# Patient Record
Sex: Female | Born: 1950 | Race: White | Hispanic: No | Smoking: Never smoker
Health system: Southern US, Community
[De-identification: ages and names within clinical notes are randomized; demographics above are authoritative.]

## PROBLEM LIST (undated history)

## (undated) DIAGNOSIS — E785 Hyperlipidemia, unspecified: Secondary | ICD-10-CM

## (undated) DIAGNOSIS — J45909 Unspecified asthma, uncomplicated: Secondary | ICD-10-CM

## (undated) DIAGNOSIS — W19XXXA Unspecified fall, initial encounter: Secondary | ICD-10-CM

## (undated) DIAGNOSIS — I10 Essential (primary) hypertension: Secondary | ICD-10-CM

## (undated) DIAGNOSIS — M6281 Muscle weakness (generalized): Secondary | ICD-10-CM

## (undated) DIAGNOSIS — J449 Chronic obstructive pulmonary disease, unspecified: Secondary | ICD-10-CM

## (undated) DIAGNOSIS — E119 Type 2 diabetes mellitus without complications: Secondary | ICD-10-CM

## (undated) DIAGNOSIS — S72009A Fracture of unspecified part of neck of unspecified femur, initial encounter for closed fracture: Secondary | ICD-10-CM

## (undated) DIAGNOSIS — S62109A Fracture of unspecified carpal bone, unspecified wrist, initial encounter for closed fracture: Secondary | ICD-10-CM

## (undated) DIAGNOSIS — K635 Polyp of colon: Secondary | ICD-10-CM

## (undated) HISTORY — PX: CHOLECYSTECTOMY: SHX55

## (undated) HISTORY — DX: Muscle weakness (generalized): M62.81

## (undated) HISTORY — DX: Hyperlipidemia, unspecified: E78.5

## (undated) HISTORY — DX: Polyp of colon: K63.5

## (undated) HISTORY — DX: Type 2 diabetes mellitus without complications: E11.9

## (undated) HISTORY — DX: Fracture of unspecified carpal bone, unspecified wrist, initial encounter for closed fracture: S62.109A

## (undated) HISTORY — PX: TOTAL HIP ARTHROPLASTY: SHX124

## (undated) HISTORY — DX: Unspecified fall, initial encounter: W19.XXXA

## (undated) HISTORY — DX: Fracture of unspecified part of neck of unspecified femur, initial encounter for closed fracture: S72.009A

## (undated) HISTORY — PX: ICD IMPLANT: EP1208

## (undated) HISTORY — DX: Unspecified asthma, uncomplicated: J45.909

## (undated) HISTORY — DX: Essential (primary) hypertension: I10

## (undated) HISTORY — PX: DILATION AND CURETTAGE OF UTERUS: SHX78

## (undated) HISTORY — PX: JOINT REPLACEMENT: SHX530

## (undated) HISTORY — DX: Chronic obstructive pulmonary disease, unspecified: J44.9

---

## 1978-11-29 HISTORY — PX: TOTAL ABDOMINAL HYSTERECTOMY: SHX209

## 1999-02-23 ENCOUNTER — Emergency Department (HOSPITAL_COMMUNITY): Admission: EM | Admit: 1999-02-23 | Discharge: 1999-02-23 | Payer: Self-pay | Admitting: Emergency Medicine

## 2018-02-22 ENCOUNTER — Encounter: Payer: Self-pay | Admitting: Internal Medicine

## 2018-02-22 ENCOUNTER — Non-Acute Institutional Stay (SKILLED_NURSING_FACILITY): Payer: Medicare Other | Admitting: Internal Medicine

## 2018-02-22 DIAGNOSIS — E785 Hyperlipidemia, unspecified: Secondary | ICD-10-CM | POA: Diagnosis not present

## 2018-02-22 DIAGNOSIS — F325 Major depressive disorder, single episode, in full remission: Secondary | ICD-10-CM

## 2018-02-22 DIAGNOSIS — E119 Type 2 diabetes mellitus without complications: Secondary | ICD-10-CM | POA: Diagnosis not present

## 2018-02-22 DIAGNOSIS — S32402D Unspecified fracture of left acetabulum, subsequent encounter for fracture with routine healing: Secondary | ICD-10-CM | POA: Diagnosis not present

## 2018-02-22 DIAGNOSIS — F419 Anxiety disorder, unspecified: Secondary | ICD-10-CM | POA: Diagnosis not present

## 2018-02-22 DIAGNOSIS — N3090 Cystitis, unspecified without hematuria: Secondary | ICD-10-CM | POA: Diagnosis not present

## 2018-02-22 DIAGNOSIS — I5043 Acute on chronic combined systolic (congestive) and diastolic (congestive) heart failure: Secondary | ICD-10-CM

## 2018-02-22 DIAGNOSIS — B9689 Other specified bacterial agents as the cause of diseases classified elsewhere: Secondary | ICD-10-CM

## 2018-02-22 DIAGNOSIS — J449 Chronic obstructive pulmonary disease, unspecified: Secondary | ICD-10-CM

## 2018-02-22 DIAGNOSIS — S62102D Fracture of unspecified carpal bone, left wrist, subsequent encounter for fracture with routine healing: Secondary | ICD-10-CM

## 2018-02-22 DIAGNOSIS — Z794 Long term (current) use of insulin: Secondary | ICD-10-CM | POA: Diagnosis not present

## 2018-02-22 DIAGNOSIS — B961 Klebsiella pneumoniae [K. pneumoniae] as the cause of diseases classified elsewhere: Secondary | ICD-10-CM

## 2018-02-22 NOTE — Progress Notes (Signed)
: Provider:  Noah Delaine. Sheppard Coil, MD Location:  [facility] Room Number: 130 Place of Service:  SNF ([phone])  PCP: Hennie Duos, MD Patient Care Team: Hennie Duos, MD as PCP - General (Internal Medicine)  Extended Emergency Contact Information Primary Emergency Contact: [place] Phone: [phone] Relation: Daughter Secondary Emergency Contact: Anne Hahn Home Phone: [phone] Relation: Son     Allergies: Azithromycin; Codeine; Demerol [meperidine hcl]; Erythromycin; Penicillins; and Sulfamethizole  Chief Complaint  Patient presents with  . New Admit To SNF    HPI: Patient is 67 y.o. female with history of diabetes type 2 COPD and hypertension who presented to [facility] ED after a fall.  She lost her balance at home and fell after which she complained of left hip and left wrist pain.  She did bump her head but no loss of consciousness or neck pain.  She has a history of hip fracture several years ago and underwent surgery in [place].  Patient was admitted to [facility] from 3/20-26 where she was diagnosed and treated for a left wrist fracture and a left acetabular fracture.  Orthopedics did not recommend inpatient surgery for either fracture, the left wrist was splinted in the emergency department.  Acetabular fracture was nondisplaced, conservative care and family wishes patient to follow-up with her own orthopedic surgeon Dr. Lyla Glassing ingrained spur after discharge.  Hospital course was complicated by acute on chronic congestive heart failure treated with IV Lasix with resolution and with a UTI that grew out Klebsiella and was treated with Levaquin.  Patient had significant anxiety and was seen by psychiatry who started her on Xanax.  Patient is admitted to skilled nursing facility for OT/PT.  While at skilled nursing facility patient will be followed for hypertension treated with Coreg Lasix and  Cozaar, diabetes mellitus type 2 treated with insulin and Glucophage and hyperlipidemia treated with Lipitor.  Past Medical History:  Diagnosis Date  . Asthma   . COPD (chronic obstructive pulmonary disease) ([place])   . Diabetes ([place])   . Fall   . Hip fracture ([place])   . HLD (hyperlipidemia)   . HTN (hypertension)   . Muscle weakness   . Wrist fracture     Past Surgical History:  Procedure Laterality Date  . CHOLECYSTECTOMY    . DILATION AND CURETTAGE OF UTERUS    . TOTAL ABDOMINAL HYSTERECTOMY    . TOTAL HIP ARTHROPLASTY Left     Allergies as of 02/22/2018      Reactions   Azithromycin    Codeine    Demerol [meperidine Hcl]    Erythromycin    Penicillins    Sulfamethizole       Medication List        Accurate as of 02/22/18 11:29 AM. Always use your most recent med list.          albuterol 108 (90 Base) MCG/ACT inhaler Commonly known as:  PROVENTIL HFA;VENTOLIN HFA Inhale 2 puffs into the lungs every 6 (six) hours as needed for wheezing or shortness of breath.   ALPRAZolam 0.25 MG tablet Commonly known as:  XANAX Take 0.25 mg by mouth 2 (two) times daily.   aspirin EC 81 MG tablet Take 81 mg by mouth daily.   atorvastatin 20 MG tablet Commonly known as:  LIPITOR Take 20 mg by mouth daily.   BREO ELLIPTA 200-25 MCG/INH Aepb Generic drug:  fluticasone furoate-vilanterol Inhale 1 puff into the  lungs daily.   calcium citrate-vitamin D 315-200 MG-UNIT tablet Commonly known as:  CITRACAL+D Take 1 tablet by mouth daily.   carvedilol 6.25 MG tablet Commonly known as:  COREG Take 6.25 mg by mouth 2 (two) times daily with a meal.   furosemide 40 MG tablet Commonly known as:  LASIX Take 40 mg by mouth daily.   LANTUS SOLOSTAR [place] Inject 20 Units into the skin daily with breakfast.   levofloxacin 500 MG tablet Commonly known as:  LEVAQUIN Take 500 mg by mouth daily.   losartan 50 MG tablet Commonly known as:  COZAAR Take 50 mg by mouth daily.     metFORMIN 500 MG tablet Commonly known as:  GLUCOPHAGE Take 500 mg by mouth 2 (two) times daily with a meal.   pantoprazole 40 MG tablet Commonly known as:  PROTONIX Take 40 mg by mouth daily.   sertraline 100 MG tablet Commonly known as:  ZOLOFT Take 200 mg by mouth daily.       No orders of the defined types were placed in this encounter.    There is no immunization history on file for this patient.  Social History   Tobacco Use  . Smoking status: Never Smoker  . Smokeless tobacco: Never Used  Substance Use Topics  . Alcohol use: Not on file    Family history is   Family History  Problem Relation Age of Onset  . AAA (abdominal aortic aneurysm) Other       Review of Systems  DATA OBTAINED: from patient, nurse GENERAL:  no fevers, fatigue, appetite changes SKIN: No itching, or rash EYES: No eye pain, redness, discharge EARS: No earache, tinnitus, change in hearing NOSE: No congestion, drainage or bleeding  MOUTH/THROAT: No mouth or tooth pain, No sore throat RESPIRATORY: No cough, wheezing, SOB CARDIAC: No chest pain, palpitations, lower extremity edema  GI: No abdominal pain, No N/V/D or constipation, No heartburn or reflux  GU: No dysuria, frequency or urgency, or incontinence  MUSCULOSKELETAL: No unrelieved bone/joint pain NEUROLOGIC: No headache, dizziness or focal weakness PSYCHIATRIC: No c/o anxiety or sadness   Vitals:   02/22/18 1108  BP: (!) 111/57  Pulse: 70  Resp: (!) 22  Temp: (!) 97 F (36.1 C)  SpO2: 96%    SpO2 Readings from Last 1 Encounters:  02/22/18 96%   There is no height or weight on file to calculate BMI.     Physical Exam  GENERAL APPEARANCE: Alert, conversant,  No acute distress.  SKIN: No diaphoresis rash HEAD: Normocephalic, atraumatic  EYES: Conjunctiva/lids clear. Pupils round, reactive. EOMs intact.  EARS: External exam WNL, canals clear. Hearing grossly normal.  NOSE: No deformity or discharge.   MOUTH/THROAT: Lips w/o lesions  RESPIRATORY: Breathing is even, unlabored. Lung sounds are clear   CARDIOVASCULAR: Heart RRR no murmurs, rubs or gallops. No peripheral edema.   GASTROINTESTINAL: Abdomen is soft, non-tender, not distended w/ normal bowel sounds. GENITOURINARY: Bladder non tender, not distended  MUSCULOSKELETAL: Splint left wrist NEUROLOGIC:  Cranial nerves 2-12 grossly intact. Moves all extremities  PSYCHIATRIC: Mood and affect appropriate to situation, no behavioral issues  There are no active problems to display for this patient.     Labs reviewed: Basic Metabolic Panel: No results found for: NA, K, CL, CO2, GLUCOSE, BUN, CREATININE, CALCIUM, PROT, ALBUMIN, AST, ALT, ALKPHOS, BILITOT, GFRNONAA, GFRAA  No results for input(s): NA, K, CL, CO2, GLUCOSE, BUN, CREATININE, CALCIUM, MG, PHOS in the last 8760 hours. Liver Function Tests: No  results for input(s): AST, ALT, ALKPHOS, BILITOT, PROT, ALBUMIN in the last 8760 hours. No results for input(s): LIPASE, AMYLASE in the last 8760 hours. No results for input(s): AMMONIA in the last 8760 hours. CBC: No results for input(s): WBC, NEUTROABS, HGB, HCT, MCV, PLT in the last 8760 hours. Lipid No results for input(s): CHOL, HDL, LDLCALC, TRIG in the last 8760 hours.  Cardiac Enzymes: No results for input(s): CKTOTAL, CKMB, CKMBINDEX, TROPONINI in the last 8760 hours. BNP: No results for input(s): BNP in the last 8760 hours. No results found for: MICROALBUR No results found for: HGBA1C No results found for: TSH No results found for: VITAMINB12 No results found for: FOLATE No results found for: IRON, TIBC, FERRITIN  Imaging and Procedures obtained prior to SNF admission: No results found.   Not all labs, radiology exams or other studies done during hospitalization come through on my EPIC note; however they are reviewed by me.    Assessment and Plan  Left acetabular fracture/left wrist fracture-surgery not  recommended for either; splint placed on left wrist; patient to follow-up with her own orthopedic doctor Swinteck in [place] after discharge SNF -omitted for OT/PT; will follow up with Dr. Lyla Glassing  Klebsiella UTI-treated with Levaquin SNF -continue Levaquin 100 mg daily for 4 more days  Acute on chronic congestive heart failure/acute respiratory failure with hypoxia-2D echo showed severely reduced LV function with severe global hypokinesis, LVEF estimated 20-25%; medications were adjusted SNF -continue Coreg 6.25 mg twice daily, Lasix 40 mg daily and Cozaar 50 mg daily  Anxiety-apparently a new problem; patient was seen by psychiatry and started on Xanax 0.25 mg twice daily SNF -continue Xanax 0.25 mg p.o. twice daily  Diabetes mellitus type 2-some hyperglycemia in hospital SNF -will be on low-carb diet which is the closest thing in a nursing home as to a diabetic diet, can you Glucophage 500 mg twice daily and Lantus insulin 20 units every morning: Patient is on statin and on ARB  COPD SNF -stable, patient recently had had an exacerbation which is cleared by the time of hospitalization; continue as needed albuterol, [place]rio Ellipta 1 puff daily  Hyperlipidemia SNF -not stated as uncontrolled; continue Lipitor 20 mg p.o. Daily  Depression SNF -continue Zoloft 200 mg p.o. daily   Time spent greater than 45 minutes;> 50% of time with patient was spent reviewing records, labs, tests and studies, counseling and developing plan of care  Webb Silversmith D. Sheppard Coil, MD

## 2018-02-25 ENCOUNTER — Encounter: Payer: Self-pay | Admitting: Internal Medicine

## 2018-02-25 DIAGNOSIS — J449 Chronic obstructive pulmonary disease, unspecified: Secondary | ICD-10-CM | POA: Insufficient documentation

## 2018-02-25 DIAGNOSIS — S62109A Fracture of unspecified carpal bone, unspecified wrist, initial encounter for closed fracture: Secondary | ICD-10-CM | POA: Insufficient documentation

## 2018-02-25 DIAGNOSIS — N3090 Cystitis, unspecified without hematuria: Secondary | ICD-10-CM

## 2018-02-25 DIAGNOSIS — B961 Klebsiella pneumoniae [K. pneumoniae] as the cause of diseases classified elsewhere: Secondary | ICD-10-CM | POA: Insufficient documentation

## 2018-02-25 DIAGNOSIS — E785 Hyperlipidemia, unspecified: Secondary | ICD-10-CM | POA: Insufficient documentation

## 2018-02-25 DIAGNOSIS — F419 Anxiety disorder, unspecified: Secondary | ICD-10-CM | POA: Insufficient documentation

## 2018-02-25 DIAGNOSIS — F325 Major depressive disorder, single episode, in full remission: Secondary | ICD-10-CM | POA: Insufficient documentation

## 2018-02-25 DIAGNOSIS — S32409A Unspecified fracture of unspecified acetabulum, initial encounter for closed fracture: Secondary | ICD-10-CM | POA: Insufficient documentation

## 2018-02-25 DIAGNOSIS — B9689 Other specified bacterial agents as the cause of diseases classified elsewhere: Secondary | ICD-10-CM | POA: Insufficient documentation

## 2018-02-25 DIAGNOSIS — I509 Heart failure, unspecified: Secondary | ICD-10-CM | POA: Insufficient documentation

## 2018-02-25 DIAGNOSIS — E119 Type 2 diabetes mellitus without complications: Secondary | ICD-10-CM | POA: Insufficient documentation

## 2018-02-27 LAB — CBC AND DIFFERENTIAL
HCT: 36 (ref 36–46)
Hemoglobin: 12.5 (ref 12.0–16.0)
Platelets: 289 (ref 150–399)
WBC: 8.1

## 2018-02-27 LAB — BASIC METABOLIC PANEL
BUN: 27 — AB (ref 4–21)
CREATININE: 0.8 (ref 0.5–1.1)
Glucose: 103
POTASSIUM: 4.3 (ref 3.4–5.3)
Sodium: 138 (ref 137–147)

## 2018-03-02 ENCOUNTER — Encounter: Payer: Self-pay | Admitting: Adult Health

## 2018-03-02 ENCOUNTER — Non-Acute Institutional Stay (SKILLED_NURSING_FACILITY): Payer: Medicare Other | Admitting: Adult Health

## 2018-03-02 DIAGNOSIS — IMO0002 Reserved for concepts with insufficient information to code with codable children: Secondary | ICD-10-CM

## 2018-03-02 DIAGNOSIS — Z794 Long term (current) use of insulin: Secondary | ICD-10-CM

## 2018-03-02 DIAGNOSIS — I110 Hypertensive heart disease with heart failure: Secondary | ICD-10-CM | POA: Insufficient documentation

## 2018-03-02 DIAGNOSIS — E1165 Type 2 diabetes mellitus with hyperglycemia: Secondary | ICD-10-CM | POA: Insufficient documentation

## 2018-03-02 DIAGNOSIS — F0150 Vascular dementia without behavioral disturbance: Secondary | ICD-10-CM | POA: Diagnosis not present

## 2018-03-02 DIAGNOSIS — E785 Hyperlipidemia, unspecified: Secondary | ICD-10-CM | POA: Diagnosis not present

## 2018-03-02 DIAGNOSIS — E1169 Type 2 diabetes mellitus with other specified complication: Secondary | ICD-10-CM | POA: Insufficient documentation

## 2018-03-02 DIAGNOSIS — F325 Major depressive disorder, single episode, in full remission: Secondary | ICD-10-CM | POA: Diagnosis not present

## 2018-03-02 DIAGNOSIS — J449 Chronic obstructive pulmonary disease, unspecified: Secondary | ICD-10-CM

## 2018-03-02 DIAGNOSIS — I5042 Chronic combined systolic (congestive) and diastolic (congestive) heart failure: Secondary | ICD-10-CM | POA: Insufficient documentation

## 2018-03-02 NOTE — Progress Notes (Signed)
Location:   [facility] Room Number: [place] of Service:  SNF (31)   CODE STATUS: Full Code  Allergies  Allergen Reactions  . Azithromycin   . Codeine   . Demerol [Meperidine Hcl]   . Erythromycin   . Penicillins   . Sulfamethizole     Chief Complaint  Patient presents with  . Acute Visit    Dementia    HPI:  She is status post left wrist and acetabular fracture. These are being treated conservatively. I have had a prolonged discussed with her daughter.  Her family had expressed concerns about her cognition. She had been leaving food out for an extended period of time; has had worsening house keeping skills; is more forgetful. Speech therapy has performed a MOCA test in which she scored 20/30. A clear indication of dementia. She did fail the clock draw portion and short term recall. The CT of head performed on 02-15-18 did demonstrate atrophy and chronic  microvascular ischemic change. She does have a long term history of diabetes which has been poorly controlled.this does represent a vascular dementia.  I have also discussed with her daughter that she will need require supervision upon her discharge from this facility.  She would be a good candidate for assisted living. If she were to go home; she will need supervision and done at home. She will not be safe to be left alone. Her family has verbalized understanding.   Past Medical History:  Diagnosis Date  . Asthma   . COPD (chronic obstructive pulmonary disease) ([place])   . Diabetes ([place])   . Fall   . Hip fracture ([place])   . HLD (hyperlipidemia)   . HTN (hypertension)   . Muscle weakness   . Wrist fracture     Past Surgical History:  Procedure Laterality Date  . CHOLECYSTECTOMY    . DILATION AND CURETTAGE OF UTERUS    . TOTAL ABDOMINAL HYSTERECTOMY    . TOTAL HIP ARTHROPLASTY Left     Social History   Socioeconomic History  . Marital status: Divorced    Spouse name: Not on file  .  Number of children: Not on file  . Years of education: Not on file  . Highest education level: Not on file  Occupational History  . Not on file  Social Needs  . Financial resource strain: Not on file  . Food insecurity:    Worry: Not on file    Inability: Not on file  . Transportation needs:    Medical: Not on file    Non-medical: Not on file  Tobacco Use  . Smoking status: Never Smoker  . Smokeless tobacco: Never Used  Substance and Sexual Activity  . Alcohol use: Not on file  . Drug use: Never  . Sexual activity: Not on file  Lifestyle  . Physical activity:    Days per week: Not on file    Minutes per session: Not on file  . Stress: Not on file  Relationships  . Social connections:    Talks on phone: Not on file    Gets together: Not on file    Attends religious service: Not on file    Active member of club or organization: Not on file    Attends meetings of clubs or organizations: Not on file    Relationship status: Not on file  . Intimate partner violence:    Fear of current or ex partner: Not on file  Emotionally abused: Not on file    Physically abused: Not on file    Forced sexual activity: Not on file  Other Topics Concern  . Not on file  Social History Narrative   Divorced   Family History  Problem Relation Age of Onset  . AAA (abdominal aortic aneurysm) Other       VITAL SIGNS BP (!) 101/57   Pulse (!) 59   Temp 97.8 F (36.6 C)   Resp 18   Wt 139 lb 9.6 oz (63.3 kg)   Outpatient Encounter Medications as of 03/02/2018  Medication Sig  . albuterol (PROVENTIL HFA;VENTOLIN HFA) 108 (90 Base) MCG/ACT inhaler Inhale 2 puffs into the lungs every 6 (six) hours as needed for wheezing or shortness of breath.  . ALPRAZolam (XANAX) 0.25 MG tablet Take 0.25 mg by mouth 2 (two) times daily.  [place] Kitchen aspirin EC 81 MG tablet Take 81 mg by mouth daily.  Mar[place] Kitchen atorvastatin (LIPITOR) 20 MG tablet Take 20 mg by mouth daily.  . calcium citrate-vitamin D (CITRACAL+D)  315-200 MG-UNIT tablet Take 1 tablet by mouth daily.  . carvedilol (COREG) 6.25 MG tablet Take 6.25 mg by mouth 2 (two) times daily with a meal.  . fluticasone furoate-vilanterol (BREO ELLIPTA) 200-25 MCG/INH AEPB Inhale 1 puff into the lungs daily.  . furosemide (LASIX) 40 MG tablet Take 40 mg by mouth daily.  . Insulin Glargine (LANTUS SOLOSTAR [place]) Inject 20 Units into the skin daily with breakfast.  . losartan (COZAAR) 50 MG tablet Take 50 mg by mouth daily.  . metFORMIN (GLUCOPHAGE) 500 MG tablet Take 500 mg by mouth 2 (two) times daily with a meal.  . pantoprazole (PROTONIX) 40 MG tablet Take 40 mg by mouth daily.  . sertraline (ZOLOFT) 100 MG tablet Take 200 mg by mouth daily.  . [DISCONTINUED] levofloxacin (LEVAQUIN) 500 MG tablet Take 500 mg by mouth daily.   No facility-administered encounter medications on file as of 03/02/2018.      SIGNIFICANT DIAGNOSTIC EXAMS  TODAY:   02-18-18: TEE: Severely reduced LV function with severe global hypokinesis. Asynchronous contraction pattern of the left ventricle likely due to IVCD. LVEF estimated at 20-25% Mild mitral regurgitation. There is aortic valve leaflet sclerosis noted, with no evidence of stenosis. Mild tricuspid regurgitation   02-15-18: ct of pelvis: Changes consistent with prior operative fixation of a proximal left femoral fracture. No new femoral fracture is seen. There is however a somewhat stellate appearing fracture involving the left acetabulum with fracture lines extending medially as well as anteriorly and coursing along the superior aspect of the acetabulum on the left. No significant displacement is noted. No other fractures are seen. Mild soft tissue hemorrhage is noted in the adjacent musculature.  02-15-18: ct of head and cervical spine: No acute abnormality head or cervical spine. Atrophy and chronic microvascular ischemic change. Atherosclerosis. Degenerative disc disease C5-6.   LABS REVIEWED: TODAY:   02-15-18: hgb  a1c 10.0 02-18-18: wbc 6.8; hgb 10.4; hct 30.2; mcv 85.3; plt 148; glucose 187; bun 13; creat 0.62; k+ 3.9; na++ 140; ca 8.6    Review of Systems  Constitutional: Negative for malaise/fatigue.  Respiratory: Negative for cough and shortness of breath.   Cardiovascular: Negative for chest pain, palpitations and leg swelling.  Gastrointestinal: Negative for abdominal pain, constipation and heartburn.  Musculoskeletal: Negative for back pain, joint pain and myalgias.       Pain is managed   Skin: Negative.   Neurological: Negative for dizziness.  Psychiatric/Behavioral:  The patient is not nervous/anxious.     Physical Exam  Constitutional: She is oriented to person, place, and time. She appears well-developed and well-nourished. No distress.  Neck: No thyromegaly present.  Cardiovascular: Normal rate, regular rhythm, normal heart sounds and intact distal pulses.  Pulmonary/Chest: Effort normal and breath sounds normal. No respiratory distress.  Abdominal: Soft. Bowel sounds are normal. She exhibits no distension.  Musculoskeletal: She exhibits no edema.  Able to move all extremities Left wrist in ace splint Is status post left acetabular fracture  Lymphadenopathy:    She has no cervical adenopathy.  Neurological: She is alert and oriented to person, place, and time.  Has poor short term recall Has failed clock draw test Cannot copy drawing Does know her current circumstance   Skin: Skin is warm. She is not diaphoretic.  Psychiatric: She has a normal mood and affect.    ASSESSMENT/ PLAN:  TODAY:   1. Chronic combined systolic and diastolic heart failure: stable EF 20-25% (02-15-18): will continue lasic 40 mg daily; coreg 25 mg twice daily   2. Hypertensive heart disease with chronic combined systolic and diastolic heart failure: stable b/p 101/57: will continue coreg 25 mg twice daily cozaar 50 mg daily asa 81 mg daily  3. COPD: stable will continue Breo ellipta 200-25 1 puff  daily  4. Dyslipidemia associated with type 2 diabetes: stable will continue lipitor 40 mg daily  5. Insulin dependent type 2 diabetes mellitus, uncontrolled: hgb a1c 10.0; will continue lantus 20 units nightly and metformin 500 mg twice daily is on arb, statin and asa  6. Depression major in remission: stable will continue zoloft 200 mg daily and xanax 0.25 mg twice daily   7. gerd without esophagitis: stable will continue protonix 40 mg daily  8. Left wrist and acetabular fracture: stable will continue therapy as directed and will continue to be followed by orthopedics.   9. Vascular dementia without behavioral disturbance: without change: weight is 139 pounds: will begin aricept 5 mg nightly for 4 weeks then increase to 10 mg nightly  in 4 weeks begin namenda xr 7 mg daily for 7 days then 14 mg daily for 7 day then 21 mg daily for 7 days then 28 mg daily long term.    Time spent with patient and family: 49 minutes: discussed her overall medical status; medications; discharge needs expectations and goals; discussed her dementia and treatment options. Verbalized understanding.      MD is aware of resident's narcotic use and is in agreement with current plan of care. We will attempt to wean resident as apropriate   Ok Edwards NP [facility]  Contact [phone] Monday through Friday 8am- 5pm  After hours call [phone]

## 2018-03-03 LAB — CBC AND DIFFERENTIAL
HEMATOCRIT: 41 (ref 36–46)
HEMOGLOBIN: 12.5 (ref 12.0–16.0)
Platelets: 138 — AB (ref 150–399)
WBC: 8.7

## 2018-03-03 LAB — BASIC METABOLIC PANEL
BUN: 46 — AB (ref 4–21)
Creatinine: 1.9 — AB (ref 0.5–1.1)
Glucose: 95
Potassium: 5.5 — AB (ref 3.4–5.3)
SODIUM: 136 — AB (ref 137–147)

## 2018-03-16 ENCOUNTER — Non-Acute Institutional Stay (SKILLED_NURSING_FACILITY): Payer: Medicare Other | Admitting: Internal Medicine

## 2018-03-16 ENCOUNTER — Encounter: Payer: Self-pay | Admitting: Internal Medicine

## 2018-03-16 DIAGNOSIS — S62102D Fracture of unspecified carpal bone, left wrist, subsequent encounter for fracture with routine healing: Secondary | ICD-10-CM

## 2018-03-16 DIAGNOSIS — F325 Major depressive disorder, single episode, in full remission: Secondary | ICD-10-CM | POA: Diagnosis not present

## 2018-03-16 DIAGNOSIS — Z794 Long term (current) use of insulin: Secondary | ICD-10-CM

## 2018-03-16 DIAGNOSIS — F0150 Vascular dementia without behavioral disturbance: Secondary | ICD-10-CM

## 2018-03-16 DIAGNOSIS — E119 Type 2 diabetes mellitus without complications: Secondary | ICD-10-CM | POA: Diagnosis not present

## 2018-03-16 NOTE — Progress Notes (Signed)
This is an acute visit.  Level care is skilled.  Facility is [facility], hospital complaint-acute visit secondary to family concerns about patient's placement after discharge from skilled nursing.  History of present illness.  Patient is a very pleasant 67 year old female seen today for family concerns about eventual placement after her stay in skilled nursing.  Patient is status post left wrist and acetabular fracture.  This is being treated conservatively and she is here for rehab.  Patient apparently has had frequent falls at home with the history of mild dementia and family is quite concerned that going home would be somewhat problematic with her history of falls-although apparently patient herself has been fairly adamant she wants to go home where she had previously been living by herself.  Previously our nurse practitioner had a prolonged discussion with her daughter about her mother's cognition- she does have a history again of falls and also leaving food out for an extended period of time and worsening housekeeping skills with more forgetfulness.  Speech therapy did perform an M OCA test which he scored 20 out of 30-.  This is a score that indicates significant dementia.  CT of the head performed in March demonstrated atrophy and chronic microvascular ischemic changes.  She also has a long-term history of diabetes which would one believe there is an element of vascular dementia.  It was thought that she will need supervision upon discharge from the facility and a good candidate for assisted living however patient apparently has been somewhat upset with this prospect.  I been asked to discuss this with her as well and also discussed this with her son Samantha Hutchinson.  I did have a fairly extensive discussion with Samantha Hutchinson- I did tell her that I could recognize that in some respects there is no place like home and being in your own environment but she did confess to having frequent falls-I did  state that she was at risk for significant fractures hip fractures that could be quite debilitating and although she may not have quite the freedom in an assisted living facility as she would have at home that would be closer supervision follow-up and it would be significantly safer- and that if she had significant abilities fractures she would not really be able to enjoy home and would probably be happier in an assisted living situation.  I also later discussed this with her son Samantha Hutchinson via phone- and said I agreed with previous assessments that she would be a much better fit for assisted living and going home by herself again would be problematic.  He expressed agreement and appreciation.  She has been started on Aricept and Namenda- and today appeared to be really good spirits very pleasant individual.  Nursing is not really reported any issues   Past Medical History:  Diagnosis Date  . Asthma   . COPD (chronic obstructive pulmonary disease) ([place])   . Diabetes ([place])   . Fall   . Hip fracture ([place])   . HLD (hyperlipidemia)   . HTN (hypertension)   . Muscle weakness   . Wrist fracture          Past Surgical History:  Procedure Laterality Date  . CHOLECYSTECTOMY    . DILATION AND CURETTAGE OF UTERUS    . TOTAL ABDOMINAL HYSTERECTOMY    . TOTAL HIP ARTHROPLASTY Left     Social History        Socioeconomic History  . Marital status: Divorced    Spouse name: Not  on file  . Number of children: Not on file  . Years of education: Not on file  . Highest education level: Not on file  Occupational History  . Not on file  Social Needs  . Financial resource strain: Not on file  . Food insecurity:    Worry: Not on file    Inability: Not on file  . Transportation needs:    Medical: Not on file    Non-medical: Not on file  Tobacco Use  . Smoking status: Never Smoker  . Smokeless tobacco: Never Used  Substance and Sexual Activity  . Alcohol use:  Not on file  . Drug use: Never  . Sexual activity: Not on file  Lifestyle  . Physical activity:    Days per week: Not on file    Minutes per session: Not on file  . Stress: Not on file  Relationships  . Social connections:    Talks on phone: Not on file    Gets together: Not on file    Attends religious service: Not on file    Active member of club or organization: Not on file    Attends meetings of clubs or organizations: Not on file    Relationship status: Not on file  . Intimate partner violence:    Fear of current or ex partner: Not on file    Emotionally abused: Not on file    Physically abused: Not on file    Forced sexual activity: Not on file  Other Topics Concern  . Not on file  Social History Narrative   Divorced        Family History  Problem Relation Age of Onset  . AAA (abdominal aortic aneurysm) Other            Medication Sig  . albuterol (PROVENTIL HFA;VENTOLIN HFA) 108 (90 Base) MCG/ACT inhaler Inhale 2 puffs into the lungs every 6 (six) hours as needed for wheezing or shortness of breath.  . ALPRAZolam (XANAX) 0.25 MG tablet Take 0.25 mg by mouth 2 (two) times daily.  Marland Kitchen aspirin EC 81 MG tablet Take 81 mg by mouth daily.  Marland Kitchen atorvastatin (LIPITOR) 20 MG tablet Take 20 mg by mouth daily.  . calcium citrate-vitamin D (CITRACAL+D) 315-200 MG-UNIT tablet Take 1 tablet by mouth daily.  . carvedilol (COREG) 6.25 MG tablet Take 6.25 mg by mouth 2 (two) times daily with a meal.  . fluticasone furoate-vilanterol (BREO ELLIPTA) 200-25 MCG/INH AEPB Inhale 1 puff into the lungs daily.  . furosemide (LASIX) 40 MG tablet Take 40 mg by mouth daily.  . Insulin Glargine (LANTUS SOLOSTAR Samantha Hutchinson) Inject 20 Units into the skin daily with breakfast.  . losartan (COZAAR) 50 MG tablet Take 50 mg by mouth daily.  . metFORMIN (GLUCOPHAGE) 500 MG tablet Take 500 mg by mouth 2 (two) times daily with a meal.  . pantoprazole (PROTONIX) 40 MG tablet Take  40 mg by mouth daily.  . sertraline (ZOLOFT) 100 MG tablet Take 200 mg by mouth daily.  . [DISCONTINUED] levofloxacin (LEVAQUIN) 500 MG tablet Take 500 mg by mouth daily.   No facility-administered encounter medications on file as of 03/02/2018.      SIGNIFICANT DIAGNOSTIC EXAMS  TODAY:   02-18-18: TEE: Severely reduced LV function with severe global hypokinesis. Asynchronous contraction pattern of the left ventricle likely due to IVCD. LVEF estimated at 20-25% Mild mitral regurgitation. There is aortic valve leaflet sclerosis noted, with no evidence of stenosis. Mild tricuspid regurgitation   02-15-18:  ct of pelvis: Changes consistent with prior operative fixation of a proximal left femoral fracture. No new femoral fracture is seen. There is however a somewhat stellate appearing fracture involving the left acetabulum with fracture lines extending medially as well as anteriorly and coursing along the superior aspect of the acetabulum on the left. No significant displacement is noted. No other fractures are seen. Mild soft tissue hemorrhage is noted in the adjacent musculature.  02-15-18: ct of head and cervical spine: No acute abnormality head or cervical spine. Atrophy and chronic microvascular ischemic change. Atherosclerosis. Degenerative disc disease C5-6.    Review of systems In general she is not complaining of any fever or chills says she feels well.  Skin is not complained no rashes or itching.  Eyes nose mouth and throat no complaints of visual changes difficulty swallowing.  Respiratory is not complaining of cough or shortness of breath.  Cardiac is not complaining of chest pain or significant lower extremity edema.  GI does not complain of abdominal discomfort nausea vomiting diarrhea constipation.  Musculoskeletal does have a history of fractures but the pain appears to be well-controlled at this point.  Neurologic is not complaining of dizziness headache syncope  or numbness.  Psych she does have a diagnosis of dementia does not complain of being anxious or depressed she is very pleasant cooperative and conversant this afternoon.  Physical exam.  She is afebrile pulse of 80 respirations of 17 blood pressure is 126/62.  In general this is a very pleasant female in no distress sitting comfortably in her chair.  Her skin is warm and dry.  She has prescription lenses visual acuity appears grossly intact sclera and conjunctive are clear.  Her oropharynx is clear mucous membranes moist.  Chest is clear to auscultation there is no labored breathing.  Heart is regular rate and rhythm without murmur gallop or rub she does not really have significant lower extremity edema.  Musculoskeletal is able to move all her extremities her left wrist continues to be in a splint-she is status post left  Acetabular fracture as well.  Grip strength appears to be intact as well as capillary refill left hand  She is ambulating in a wheelchair.  Neurologic is grossly intact her speech is clear no lateralizing findings cranial nerves intact.  Psych as noted above--has tested positive for dementia-she is oriented to self day of the week month her home address she can talk about her children but she does have frequent forgetfulness and limited recall at times especially in regards to short-term memory.     LABS REVIEWED:   02-15-18: hgb a1c 10.0  02-18-18: wbc 6.8; hgb 10.4; hct 30.2; mcv 85.3; plt 148; glucose 187; bun 13; creat 0.62; k+ 3.9; na++ 140; ca 8.6   Assessment and plan.  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  Today she appears to be more open to the  possibility-as noted above we did discuss extensively the advantages of assisted living- she says she will consider it- and does understand it would be safer.  I subsequently discussed this with her son Samantha Hutchinson via phone as well-at this point will monitor again this is understandably somewhat stressful for the family that we  will certainly be glad to help in any way we can.  She has been started on Aricept and Namenda which will be titrated up.  I did discuss medications with her son as well saying sometimes there is significant benefit and other times not so much and he expressed understanding.  2.  History of depression this appears to be in remission she is bright alert and quite engaged today she is on Zoloft as well as Xanax twice a day  #3- history of insulin-dependent type 2 diabetes she is on Lantus 20 units at night as well as Glucophage 500 mg twice daily she is on an arm statin and aspirin blood sugars in the morning appear to be mainly in the lower 100s with an occasional reading under 100--usually does not go below 90.  Later in the day sugars are comparable with occasional reading above 200 but this is quite rare.  4.  History of left wrist and acetabular fracture-she appears to be doing well with this pain appears to be controlled we will need continued therapy and orthopedic follow-up  [phone]

## 2018-03-19 ENCOUNTER — Encounter: Payer: Self-pay | Admitting: Internal Medicine

## 2018-03-28 ENCOUNTER — Encounter: Payer: Self-pay | Admitting: Internal Medicine

## 2018-03-28 ENCOUNTER — Non-Acute Institutional Stay (SKILLED_NURSING_FACILITY): Payer: Medicare Other | Admitting: Internal Medicine

## 2018-03-28 DIAGNOSIS — I110 Hypertensive heart disease with heart failure: Secondary | ICD-10-CM | POA: Diagnosis not present

## 2018-03-28 DIAGNOSIS — I5042 Chronic combined systolic (congestive) and diastolic (congestive) heart failure: Secondary | ICD-10-CM | POA: Diagnosis not present

## 2018-03-28 DIAGNOSIS — E785 Hyperlipidemia, unspecified: Secondary | ICD-10-CM | POA: Diagnosis not present

## 2018-03-28 DIAGNOSIS — F325 Major depressive disorder, single episode, in full remission: Secondary | ICD-10-CM

## 2018-03-28 NOTE — Progress Notes (Signed)
Location:  [facility] Room Number: [phone] Place of Service:  SNF (31)  Hennie Duos, MD  Patient Care Team: Hennie Duos, MD as PCP - General (Internal Medicine)  Extended Emergency Contact Information Primary Emergency Contact: [place] Phone: [phone] Relation: Daughter Secondary Emergency Contact: Delrose Rohwer Hahn Home Phone: [phone] Relation: Son    Allergies: Azithromycin; Codeine; Demerol [meperidine hcl]; Erythromycin; Penicillins; and Sulfamethizole  Chief Complaint  Patient presents with  . Medical Management of Chronic Issues    Routine Visit    HPI: Patient is 67 y.o. female who is being seen for routine issues of hypertension, hyperlipidemia, and depression.  Past Medical History:  Diagnosis Date  . Asthma   . Colon polyp   . COPD (chronic obstructive pulmonary disease) ([place])   . Diabetes ([place])   . Fall   . Hip fracture ([place])   . HLD (hyperlipidemia)   . HTN (hypertension)   . Muscle weakness   . Wrist fracture     Past Surgical History:  Procedure Laterality Date  . CHOLECYSTECTOMY    . DILATION AND CURETTAGE OF UTERUS    . JOINT REPLACEMENT    . TOTAL ABDOMINAL HYSTERECTOMY  1980  . TOTAL HIP ARTHROPLASTY Left     Allergies as of 03/28/2018      Reactions   Azithromycin    Codeine    Demerol [meperidine Hcl]    Erythromycin    Penicillins    Sulfamethizole       Medication List        Accurate as of 03/28/18 11:59 PM. Always use your most recent med list.          acetaminophen 500 MG tablet Commonly known as:  TYLENOL Take 500 mg by mouth every 8 (eight) hours as needed.   albuterol 108 (90 Base) MCG/ACT inhaler Commonly known as:  PROVENTIL HFA;VENTOLIN HFA Inhale 2 puffs into the lungs every 6 (six) hours as needed for wheezing or shortness of breath.   ALPRAZolam 0.25 MG tablet Commonly known as:  XANAX Take 0.25 mg by mouth 2 (two) times daily.   aspirin EC 81 MG  tablet Take 81 mg by mouth daily.   atorvastatin 20 MG tablet Commonly known as:  LIPITOR Take 20 mg by mouth daily.   BREO ELLIPTA 200-25 MCG/INH Aepb Generic drug:  fluticasone furoate-vilanterol Inhale 1 puff into the lungs daily.   calcium citrate-vitamin D 315-200 MG-UNIT tablet Commonly known as:  CITRACAL+D Take 1 tablet by mouth daily.   carvedilol 6.25 MG tablet Commonly known as:  COREG Take 6.25 mg by mouth 2 (two) times daily with a meal.   furosemide 20 MG tablet Commonly known as:  LASIX Take 20 mg by mouth daily as needed. For weight gain > 3lbs   LANTUS SOLOSTAR [place] Inject 20 Units into the skin daily with breakfast.   losartan 50 MG tablet Commonly known as:  COZAAR Take 50 mg by mouth daily.   metFORMIN 500 MG tablet Commonly known as:  GLUCOPHAGE Take 500 mg by mouth 2 (two) times daily with a meal.   pantoprazole 40 MG tablet Commonly known as:  PROTONIX Take 40 mg by mouth daily.   potassium chloride SA 20 MEQ tablet Commonly known as:  K-DUR,KLOR-CON Take 20 mEq by mouth daily.   sertraline 100 MG tablet Commonly known as:  ZOLOFT Take 200 mg by mouth daily.       No orders of the defined  types were placed in this encounter.   Immunization History  Administered Date(s) Administered  . Influenza-Unspecified 09/23/2016, 09/05/2017  . Pneumococcal Conjugate-13 02/14/2017  . Pneumococcal Polysaccharide-23 04/27/2010  . Tdap 04/27/2010    Social History   Tobacco Use  . Smoking status: Never Smoker  . Smokeless tobacco: Never Used  Substance Use Topics  . Alcohol use: Not on file    Review of Systems  DATA OBTAINED: from patient, nurse GENERAL:  no fevers, fatigue, appetite changes SKIN: No itching, rash HEENT: No complaint RESPIRATORY: No cough, wheezing, SOB CARDIAC: No chest pain, palpitations, lower extremity edema  GI: No abdominal pain, No N/V/D or constipation, No heartburn or reflux  GU: No dysuria, frequency or  urgency, or incontinence  MUSCULOSKELETAL: No unrelieved bone/joint pain NEUROLOGIC: No headache, dizziness  PSYCHIATRIC: No overt anxiety or sadness  Vitals:   03/28/18 1433  BP: 130/77  Pulse: 87  Resp: 18  Temp: (!) 97.3 F (36.3 C)  SpO2: 92%   Body mass index is 23.4 kg/m. Physical Exam  GENERAL APPEARANCE: Alert, conversant, No acute distress  SKIN: No diaphoresis rash HEENT: Unremarkable RESPIRATORY: Breathing is even, unlabored. Lung sounds are clear   CARDIOVASCULAR: Heart RRR no murmurs, rubs or gallops. No peripheral edema  GASTROINTESTINAL: Abdomen is soft, non-tender, not distended w/ normal bowel sounds.  GENITOURINARY: Bladder non tender, not distended  MUSCULOSKELETAL: No abnormal joints or musculature NEUROLOGIC: Cranial nerves 2-12 grossly intact. Moves all extremities PSYCHIATRIC: Mood and affect appropriate to situation, no behavioral issues  Patient Active Problem List   Diagnosis Date Noted  . Acute respiratory failure with hypoxia ([place]) 04/18/2018  . Chronic combined systolic and diastolic heart failure ([place]) 03/02/2018  . Hypertensive heart disease with congestive heart failure ([place]) 03/02/2018  . Dyslipidemia associated with type 2 diabetes mellitus ([place]) 03/02/2018  . Insulin dependent type 2 diabetes mellitus, uncontrolled ([place]) 03/02/2018  . Vascular dementia without behavioral disturbance 03/02/2018  . Acetabular fracture ([place]) 02/25/2018  . Wrist fracture 02/25/2018  . Klebsiella cystitis 02/25/2018  . Acute on chronic congestive heart failure ([place]) 02/25/2018  . Anxiety 02/25/2018  . Diabetes ([place]) 02/25/2018  . COPD (chronic obstructive pulmonary disease) ([place]) 02/25/2018  . HLD (hyperlipidemia) 02/25/2018  . Depression, major, in remission ([place]) 02/25/2018    CMP     Component Value Date/Time   NA 136 (A) 03/03/2018   K 5.5 (A) 03/03/2018   BUN 46 (A) 03/03/2018   CREATININE 1.9 (A) 03/03/2018   Recent Labs    02/27/18 03/03/18   NA 138 136*  K 4.3 5.5*  BUN 27* 46*  CREATININE 0.8 1.9*   No results for input(s): AST, ALT, ALKPHOS, BILITOT, PROT, ALBUMIN in the last 8760 hours. Recent Labs    02/27/18 03/03/18  WBC 8.1 8.7  HGB 12.5 12.5  HCT 36 41  PLT 289 138*   No results for input(s): CHOL, LDLCALC, TRIG in the last 8760 hours.  Invalid input(s): HCL No results found for: MICROALBUR No results found for: TSH No results found for: HGBA1C No results found for: CHOL, HDL, LDLCALC, LDLDIRECT, TRIG, CHOLHDL  Significant Diagnostic Results in last 30 days:  No results found.  Assessment and Plan  Hypertensive heart disease with congestive heart failure (HCC) Controlled; continue Coreg 6.25 mg twice daily and Cozaar 50 mg daily  HLD (hyperlipidemia) Not stated as uncontrolled; continue Lipitor 20 mg daily  Depression, major, in remission ([place]) Appears controlled; continue Zoloft 200 mg daily    Berlin Viereck D.  Sheppard Coil, MD

## 2018-03-31 ENCOUNTER — Non-Acute Institutional Stay (SKILLED_NURSING_FACILITY): Payer: Medicare Other | Admitting: Internal Medicine

## 2018-03-31 DIAGNOSIS — F0150 Vascular dementia without behavioral disturbance: Secondary | ICD-10-CM | POA: Diagnosis not present

## 2018-03-31 DIAGNOSIS — I5042 Chronic combined systolic (congestive) and diastolic (congestive) heart failure: Secondary | ICD-10-CM | POA: Diagnosis not present

## 2018-03-31 DIAGNOSIS — Z7189 Other specified counseling: Secondary | ICD-10-CM | POA: Diagnosis not present

## 2018-03-31 DIAGNOSIS — J9611 Chronic respiratory failure with hypoxia: Secondary | ICD-10-CM

## 2018-04-17 ENCOUNTER — Encounter: Payer: Self-pay | Admitting: Internal Medicine

## 2018-04-17 ENCOUNTER — Non-Acute Institutional Stay (SKILLED_NURSING_FACILITY): Payer: Medicare Other | Admitting: Internal Medicine

## 2018-04-17 DIAGNOSIS — Z794 Long term (current) use of insulin: Secondary | ICD-10-CM | POA: Diagnosis not present

## 2018-04-17 DIAGNOSIS — S62102D Fracture of unspecified carpal bone, left wrist, subsequent encounter for fracture with routine healing: Secondary | ICD-10-CM | POA: Diagnosis not present

## 2018-04-17 DIAGNOSIS — E119 Type 2 diabetes mellitus without complications: Secondary | ICD-10-CM | POA: Diagnosis not present

## 2018-04-17 DIAGNOSIS — E1169 Type 2 diabetes mellitus with other specified complication: Secondary | ICD-10-CM | POA: Diagnosis not present

## 2018-04-17 DIAGNOSIS — J9601 Acute respiratory failure with hypoxia: Secondary | ICD-10-CM

## 2018-04-17 DIAGNOSIS — J449 Chronic obstructive pulmonary disease, unspecified: Secondary | ICD-10-CM | POA: Diagnosis not present

## 2018-04-17 DIAGNOSIS — F325 Major depressive disorder, single episode, in full remission: Secondary | ICD-10-CM

## 2018-04-17 DIAGNOSIS — F419 Anxiety disorder, unspecified: Secondary | ICD-10-CM | POA: Diagnosis not present

## 2018-04-17 DIAGNOSIS — B961 Klebsiella pneumoniae [K. pneumoniae] as the cause of diseases classified elsewhere: Secondary | ICD-10-CM

## 2018-04-17 DIAGNOSIS — B9689 Other specified bacterial agents as the cause of diseases classified elsewhere: Secondary | ICD-10-CM | POA: Diagnosis not present

## 2018-04-17 DIAGNOSIS — I5043 Acute on chronic combined systolic (congestive) and diastolic (congestive) heart failure: Secondary | ICD-10-CM | POA: Diagnosis not present

## 2018-04-17 DIAGNOSIS — S32402D Unspecified fracture of left acetabulum, subsequent encounter for fracture with routine healing: Secondary | ICD-10-CM | POA: Diagnosis not present

## 2018-04-17 DIAGNOSIS — N3090 Cystitis, unspecified without hematuria: Secondary | ICD-10-CM | POA: Diagnosis not present

## 2018-04-17 DIAGNOSIS — E785 Hyperlipidemia, unspecified: Secondary | ICD-10-CM

## 2018-04-17 NOTE — Progress Notes (Signed)
Provider: Noah Delaine. Sheppard Coil, D.O.  Location:  [place] [facility] [place] Room Number: 511-P Place of Service:  SNF (31)  PCP: Hennie Duos, MD Patient Care Team: Hennie Duos, MD as PCP - General (Internal Medicine)  Extended Emergency Contact Information Primary Emergency Contact: [place] Phone: [phone] Relation: Daughter Secondary Emergency Contact: Raynisha Avilla Hahn Home Phone: [phone] Relation: Son  Allergies  Allergen Reactions  . Azithromycin   . Codeine   . Demerol [Meperidine Hcl]   . Erythromycin   . Penicillins   . Sulfamethizole     Chief Complaint  Patient presents with  . Discharge Note    Discharge from [facility], SNF     HPI:  67 y.o. female with diabetes type 2, COPD, and hypertension who presented to [facility] ED after a fall.  Patient was admitted to [facility] from 3/20-26 where she was diagnosed and treated for a left wrist fracture in the left acetabular fracture.  Orthopedics did not represent inpatient surgery for either fracture.  The left wrist was splinted in the emergency department.  The acetabular fracture was nondisplaced conservative care was recommended and per family wishes the patient is to follow-up with her own orthopedic surgeon Dr. Lyla Glassing after discharge.  Hospital course was complicated by acute on chronic congestive heart failure treated with IV Lasix with resolution and with a UTI that grew out Klebsiella and was treated with Levaquin.  Patient had significant anxiety and was seen by psychiatry who started her on Xanax. Patient was admitted to skilled nursing facility for OT/PT and is now ready to be discharged home.    Past Medical History:  Diagnosis Date  . Asthma   . Colon polyp   . COPD (chronic obstructive pulmonary disease) ([place])   . Diabetes ([place])   . Fall   . Hip fracture ([place])   . HLD (hyperlipidemia)   . HTN (hypertension)   . Muscle  weakness   . Wrist fracture     Past Surgical History:  Procedure Laterality Date  . CHOLECYSTECTOMY    . DILATION AND CURETTAGE OF UTERUS    . JOINT REPLACEMENT    . TOTAL ABDOMINAL HYSTERECTOMY  1980  . TOTAL HIP ARTHROPLASTY Left      reports that she has never smoked. She has never used smokeless tobacco. She reports that she does not use drugs. Her alcohol history is not on file. Social History   Socioeconomic History  . Marital status: Divorced    Spouse name: Not on file  . Number of children: Not on file  . Years of education: Not on file  . Highest education level: Not on file  Occupational History  . Not on file  Social Needs  . Financial resource strain: Not on file  . Food insecurity:    Worry: Not on file    Inability: Not on file  . Transportation needs:    Medical: Not on file    Non-medical: Not on file  Tobacco Use  . Smoking status: Never Smoker  . Smokeless tobacco: Never Used  Substance and Sexual Activity  . Alcohol use: Not on file  . Drug use: Never  . Sexual activity: Not on file  Lifestyle  . Physical activity:    Days per week: Not on file    Minutes per session: Not on file  . Stress: Not on file  Relationships  . Social connections:    Talks  on phone: Not on file    Gets together: Not on file    Attends religious service: Not on file    Active member of club or organization: Not on file    Attends meetings of clubs or organizations: Not on file    Relationship status: Not on file  . Intimate partner violence:    Fear of current or ex partner: Not on file    Emotionally abused: Not on file    Physically abused: Not on file    Forced sexual activity: Not on file  Other Topics Concern  . Not on file  Social History Narrative   Divorced    Pertinent  Health Maintenance Due  Topic Date Due  . HEMOGLOBIN A1C  02-19-1951  . FOOT EXAM  11/27/1959  . OPHTHALMOLOGY EXAM  11/27/1959  . MAMMOGRAM  11/27/1999  . COLONOSCOPY   11/27/1999  . DEXA SCAN  11/26/2014  . PNA vac Low Risk Adult (2 of 2 - PPSV23) 02/14/2018  . INFLUENZA VACCINE  06/29/2018    Medications: Allergies as of 04/17/2018      Reactions   Azithromycin    Codeine    Demerol [meperidine Hcl]    Erythromycin    Penicillins    Sulfamethizole       Medication List        Accurate as of 04/17/18 11:59 PM. Always use your most recent med list.          acetaminophen 500 MG tablet Commonly known as:  TYLENOL Take 500 mg by mouth every 8 (eight) hours as needed.   albuterol 108 (90 Base) MCG/ACT inhaler Commonly known as:  PROVENTIL HFA;VENTOLIN HFA Inhale 2 puffs into the lungs every 6 (six) hours as needed for wheezing or shortness of breath.   ALPRAZolam 0.25 MG tablet Commonly known as:  XANAX Take 0.25 mg by mouth 2 (two) times daily.   aspirin EC 81 MG tablet Take 81 mg by mouth daily.   atorvastatin 20 MG tablet Commonly known as:  LIPITOR Take 20 mg by mouth daily.   BREO ELLIPTA 200-25 MCG/INH Aepb Generic drug:  fluticasone furoate-vilanterol Inhale 1 puff into the lungs daily.   calcium citrate-vitamin D 315-200 MG-UNIT tablet Commonly known as:  CITRACAL+D Take 1 tablet by mouth daily.   carvedilol 6.25 MG tablet Commonly known as:  COREG Take 6.25 mg by mouth 2 (two) times daily with a meal.   donepezil 10 MG tablet Commonly known as:  ARICEPT Take 10 mg by mouth at bedtime. For Vascular Dementia   furosemide 20 MG tablet Commonly known as:  LASIX Take 20 mg by mouth daily as needed. For weight gain > 3lbs   LANTUS SOLOSTAR [place] Inject 20 Units into the skin daily with breakfast.   losartan 50 MG tablet Commonly known as:  COZAAR Take 50 mg by mouth daily.   memantine 28 MG Cp24 24 hr capsule Commonly known as:  NAMENDA XR Take 28 mg by mouth daily.   metFORMIN 500 MG tablet Commonly known as:  GLUCOPHAGE Take 500 mg by mouth 2 (two) times daily with a meal.   pantoprazole 40 MG  tablet Commonly known as:  PROTONIX Take 40 mg by mouth daily.   potassium chloride SA 20 MEQ tablet Commonly known as:  K-DUR,KLOR-CON Take 20 mEq by mouth daily.   sertraline 100 MG tablet Commonly known as:  ZOLOFT Take 200 mg by mouth daily.        Vitals:  04/17/18 1219  BP: 125/80  Pulse: 66  Resp: 16  Temp: 98 F (36.7 C)  SpO2: 93%  Weight: 140 lb 9.6 oz (63.8 kg)  Height: 5\' 5"  (1.651 m)   Body mass index is 23.4 kg/m.  Physical Exam  GENERAL APPEARANCE: Alert, conversant. No acute distress.  HEENT: Unremarkable. RESPIRATORY: Breathing is even, unlabored. Lung sounds are clear   CARDIOVASCULAR: Heart RRR no murmurs, rubs or gallops. No peripheral edema.  GASTROINTESTINAL: Abdomen is soft, non-tender, not distended w/ normal bowel sounds.  NEUROLOGIC: Cranial nerves 2-12 grossly intact. Moves all extremities   Labs reviewed: Basic Metabolic Panel: Recent Labs    02/27/18 03/03/18  NA 138 136*  K 4.3 5.5*  BUN 27* 46*  CREATININE 0.8 1.9*   No results found for: [facility] Liver Function Tests: No results for input(s): AST, ALT, ALKPHOS, BILITOT, PROT, ALBUMIN in the last 8760 hours. No results for input(s): LIPASE, AMYLASE in the last 8760 hours. No results for input(s): AMMONIA in the last 8760 hours. CBC: Recent Labs    02/27/18 03/03/18  WBC 8.1 8.7  HGB 12.5 12.5  HCT 36 41  PLT 289 138*   Lipid No results for input(s): CHOL, HDL, LDLCALC, TRIG in the last 8760 hours. Cardiac Enzymes: No results for input(s): CKTOTAL, CKMB, CKMBINDEX, TROPONINI in the last 8760 hours. BNP: No results for input(s): BNP in the last 8760 hours. CBG: No results for input(s): GLUCAP in the last 8760 hours.  Procedures and Imaging Studies During Stay: No results found.  Assessment/Plan:   Closed nondisplaced fracture of left acetabulum with routine healing, unspecified portion of acetabulum, subsequent encounter  Closed fracture of left wrist with  routine healing, subsequent encounter  Klebsiella cystitis  Acute on chronic combined systolic and diastolic congestive heart failure (HCC)  Anxiety  Acute respiratory failure with hypoxia (HCC)  Type 2 diabetes mellitus without complication, with long-term current use of insulin (HCC)  Chronic obstructive pulmonary disease, unspecified COPD type ([place])  Dyslipidemia associated with type 2 diabetes mellitus ([place])  Depression, major, in remission ([place])   Patient is being discharged with the following home health services: OT/PT/nursing  Patient is being discharged with the following durable medical equipment: Hospital bed, wheelchair, rolling walker with armrest, 3 and 1 bedside commode, long reacher  Patient has been advised to f/u with their PCP in 1-2 weeks to bring them up to date on their rehab stay.  Social services at facility was responsible for arranging this appointment.  Pt was provided with a 30 day supply of prescriptions for medications and refills must be obtained from their PCP.  For controlled substances, a more limited supply may be provided adequate until PCP appointment only.  Medications have been reconciled.  Time spent greater than 30 minutes;> 50% of time with patient was spent reviewing records, labs, tests and studies, counseling and developing plan of care  Noah Delaine.Jamielee Mchale,MD.

## 2018-04-18 ENCOUNTER — Encounter: Payer: Self-pay | Admitting: Internal Medicine

## 2018-04-18 DIAGNOSIS — J9601 Acute respiratory failure with hypoxia: Secondary | ICD-10-CM | POA: Insufficient documentation

## 2018-04-22 ENCOUNTER — Encounter: Payer: Self-pay | Admitting: Internal Medicine

## 2018-04-22 NOTE — Assessment & Plan Note (Signed)
Appears controlled; continue Zoloft 200 mg daily

## 2018-04-22 NOTE — Assessment & Plan Note (Signed)
Controlled; continue Coreg 6.25 mg twice daily and Cozaar 50 mg daily

## 2018-04-22 NOTE — Assessment & Plan Note (Signed)
Not stated as uncontrolled; continue Lipitor 20 mg daily

## 2018-04-23 NOTE — Progress Notes (Signed)
Location:  [place] of Service:  SNF (31)  Hennie Duos, MD  Patient Care Team: Hennie Duos, MD as PCP - General (Internal Medicine)  Extended Emergency Contact Information Primary Emergency Contact: [place] Phone: [phone] Relation: Daughter Secondary Emergency Contact: Lavana Huckeba Hahn Home Phone: [phone] Relation: Son    Allergies: Azithromycin; Codeine; Demerol [meperidine hcl]; Erythromycin; Penicillins; and Sulfamethizole  Chief Complaint  Patient presents with  . Acute Visit    HPI: Patient is 67 y.o. female with chronic combined systolic and diastolic congestive heart failure, type 2 diabetes mellitus, COPD, hyperlipidemia, depression, is being seen today and who is part of a meeting, very long meeting with her family and with myself, social work, nursing, and physical therapy to discuss the realities of patient's disease and appropriate discharge for the patient.  Past Medical History:  Diagnosis Date  . Asthma   . Colon polyp   . COPD (chronic obstructive pulmonary disease) ([place])   . Diabetes ([place])   . Fall   . Hip fracture ([place])   . HLD (hyperlipidemia)   . HTN (hypertension)   . Muscle weakness   . Wrist fracture     Past Surgical History:  Procedure Laterality Date  . CHOLECYSTECTOMY    . DILATION AND CURETTAGE OF UTERUS    . JOINT REPLACEMENT    . TOTAL ABDOMINAL HYSTERECTOMY  1980  . TOTAL HIP ARTHROPLASTY Left     Allergies as of 03/31/2018      Reactions   Azithromycin    Codeine    Demerol [meperidine Hcl]    Erythromycin    Penicillins    Sulfamethizole       Medication List        Accurate as of 03/31/18 11:59 PM. Always use your most recent med list.          acetaminophen 500 MG tablet Commonly known as:  TYLENOL Take 500 mg by mouth every 8 (eight) hours as needed.   albuterol 108 (90 Base) MCG/ACT inhaler Commonly known as:  PROVENTIL HFA;VENTOLIN HFA Inhale 2 puffs  into the lungs every 6 (six) hours as needed for wheezing or shortness of breath.   ALPRAZolam 0.25 MG tablet Commonly known as:  XANAX Take 0.25 mg by mouth 2 (two) times daily.   aspirin EC 81 MG tablet Take 81 mg by mouth daily.   atorvastatin 20 MG tablet Commonly known as:  LIPITOR Take 20 mg by mouth daily.   BREO ELLIPTA 200-25 MCG/INH Aepb Generic drug:  fluticasone furoate-vilanterol Inhale 1 puff into the lungs daily.   calcium citrate-vitamin D 315-200 MG-UNIT tablet Commonly known as:  CITRACAL+D Take 1 tablet by mouth daily.   carvedilol 6.25 MG tablet Commonly known as:  COREG Take 6.25 mg by mouth 2 (two) times daily with a meal.   furosemide 20 MG tablet Commonly known as:  LASIX Take 20 mg by mouth daily as needed. For weight gain > 3lbs   LANTUS SOLOSTAR [place] Inject 20 Units into the skin daily with breakfast.   losartan 50 MG tablet Commonly known as:  COZAAR Take 50 mg by mouth daily.   metFORMIN 500 MG tablet Commonly known as:  GLUCOPHAGE Take 500 mg by mouth 2 (two) times daily with a meal.   pantoprazole 40 MG tablet Commonly known as:  PROTONIX Take 40 mg by mouth daily.   potassium chloride SA 20 MEQ tablet Commonly known as:  K-DUR,KLOR-CON Take 20  mEq by mouth daily.   sertraline 100 MG tablet Commonly known as:  ZOLOFT Take 200 mg by mouth daily.       No orders of the defined types were placed in this encounter.   Immunization History  Administered Date(s) Administered  . Influenza-Unspecified 09/23/2016, 09/05/2017  . Pneumococcal Conjugate-13 02/14/2017  . Pneumococcal Polysaccharide-23 04/27/2010  . Tdap 04/27/2010    Social History   Tobacco Use  . Smoking status: Never Smoker  . Smokeless tobacco: Never Used  Substance Use Topics  . Alcohol use: Not on file    Review of Systems  DATA OBTAINED: from patient, nurse GENERAL:  no fevers, fatigue, appetite changes SKIN: No itching, rash HEENT: No  complaint RESPIRATORY: No cough, wheezing, SOB CARDIAC: No chest pain, palpitations, lower extremity edema  GI: No abdominal pain, No N/V/D or constipation, No heartburn or reflux  GU: No dysuria, frequency or urgency, or incontinence  MUSCULOSKELETAL: No unrelieved bone/joint pain NEUROLOGIC: No headache, dizziness  PSYCHIATRIC: No overt anxiety or sadness  Vitals:   05/14/18 1607  BP: 133/77  Pulse: 87  Resp: 18  Temp: (!) 97.3 F (36.3 C)   Body mass index is 23.4 kg/m. Physical Exam  GENERAL APPEARANCE: Alert, conversant, No acute distress  SKIN: No diaphoresis rash HEENT: Unremarkable RESPIRATORY: Breathing is even, unlabored. Lung sounds are clear   CARDIOVASCULAR: Heart RRR no murmurs, rubs or gallops. No peripheral edema  GASTROINTESTINAL: Abdomen is soft, non-tender, not distended w/ normal bowel sounds.  GENITOURINARY: Bladder non tender, not distended  MUSCULOSKELETAL: No abnormal joints or musculature NEUROLOGIC: Cranial nerves 2-12 grossly intact. Moves all extremities PSYCHIATRIC: Mood and affect appropriate to situation with dementia, no behavioral issues  Patient Active Problem List   Diagnosis Date Noted  . Acute respiratory failure with hypoxia ([place]) 04/18/2018  . Chronic combined systolic and diastolic heart failure ([place]) 03/02/2018  . Hypertensive heart disease with congestive heart failure ([place]) 03/02/2018  . Dyslipidemia associated with type 2 diabetes mellitus ([place]) 03/02/2018  . Insulin dependent type 2 diabetes mellitus, uncontrolled ([place]) 03/02/2018  . Vascular dementia without behavioral disturbance 03/02/2018  . Acetabular fracture ([place]) 02/25/2018  . Wrist fracture 02/25/2018  . Klebsiella cystitis 02/25/2018  . Acute on chronic congestive heart failure ([place]) 02/25/2018  . Anxiety 02/25/2018  . Diabetes ([place]) 02/25/2018  . COPD (chronic obstructive pulmonary disease) ([place]) 02/25/2018  . HLD (hyperlipidemia) 02/25/2018  . Depression, major,  in remission ([place]) 02/25/2018    CMP     Component Value Date/Time   NA 136 (A) 03/03/2018   K 5.5 (A) 03/03/2018   BUN 46 (A) 03/03/2018   CREATININE 1.9 (A) 03/03/2018   Recent Labs    02/27/18 03/03/18  NA 138 136*  K 4.3 5.5*  BUN 27* 46*  CREATININE 0.8 1.9*   No results for input(s): AST, ALT, ALKPHOS, BILITOT, PROT, ALBUMIN in the last 8760 hours. Recent Labs    02/27/18 03/03/18  WBC 8.1 8.7  HGB 12.5 12.5  HCT 36 41  PLT 289 138*   No results for input(s): CHOL, LDLCALC, TRIG in the last 8760 hours.  Invalid input(s): HCL No results found for: MICROALBUR No results found for: TSH No results found for: HGBA1C No results found for: CHOL, HDL, LDLCALC, LDLDIRECT, TRIG, CHOLHDL  Significant Diagnostic Results in last 30 days:  No results found.  Assessment and Plan  Vascular dementia/chronic hypoxic respiratory failure/chronic congestive heart failure/ encounter for family meeting/advanced care planning- in attendance are myself, nursing,  social work, and physical therapy; the goal of the meeting was to let family know that patient was not going to be able to live by herself anymore and that she needed help with most ADLs and would need 24/7 supervision secondary to her dementia and I believe that this goal was achieved; in addition the family filled out a  MOST form with my help and discussion   Time spent greater than 70 minutes Inocencio Homes, MD

## 2018-05-14 ENCOUNTER — Encounter: Payer: Self-pay | Admitting: Internal Medicine

## 2020-05-07 ENCOUNTER — Other Ambulatory Visit: Payer: Self-pay

## 2020-05-07 ENCOUNTER — Ambulatory Visit: Payer: Medicare Other | Admitting: Podiatry

## 2020-05-07 VITALS — Temp 96.0°F

## 2020-05-07 DIAGNOSIS — M79676 Pain in unspecified toe(s): Secondary | ICD-10-CM

## 2020-05-07 DIAGNOSIS — B351 Tinea unguium: Secondary | ICD-10-CM | POA: Diagnosis not present

## 2020-05-07 DIAGNOSIS — E0843 Diabetes mellitus due to underlying condition with diabetic autonomic (poly)neuropathy: Secondary | ICD-10-CM

## 2020-05-07 NOTE — Progress Notes (Signed)
   SUBJECTIVE Patient with a history of diabetes mellitus presents to office today complaining of elongated, thickened nails that cause pain while ambulating in shoes.  She is unable to trim her own nails. Patient is here for further evaluation and treatment.   Past Medical History:  Diagnosis Date  . Asthma   . Colon polyp   . COPD (chronic obstructive pulmonary disease) ([place])   . Diabetes ([place])   . Fall   . Hip fracture ([place])   . HLD (hyperlipidemia)   . HTN (hypertension)   . Muscle weakness   . Wrist fracture     OBJECTIVE General Patient is awake, alert, and oriented x 3 and in no acute distress. Derm Skin is dry and supple bilateral. Negative open lesions or macerations. Remaining integument unremarkable. Nails are tender, long, thickened and dystrophic with subungual debris, consistent with onychomycosis, 1-5 bilateral. No signs of infection noted. Vasc  DP and PT pedal pulses palpable bilaterally. Temperature gradient within normal limits.  Neuro Epicritic and protective threshold sensation diminished bilaterally.  Musculoskeletal Exam No symptomatic pedal deformities noted bilateral. Muscular strength within normal limits.  ASSESSMENT 1. Diabetes Mellitus uncomplicated 2. Onychomycosis of nail due to dermatophyte bilateral   PLAN OF CARE 1. Patient evaluated today. 2. Instructed to maintain good pedal hygiene and foot care. Stressed importance of controlling blood sugar.  3. Mechanical debridement of nails 1-5 bilaterally performed using a nail nipper. Filed with dremel without incident.  4. Return to clinic in 3 mos.     Edrick Kins, DPM [facility]  Dr. Edrick Kins, [place]                                        [place], [place] [address]                Office [phone]  Fax [phone]

## 2020-08-12 ENCOUNTER — Encounter: Payer: Self-pay | Admitting: Podiatry

## 2020-08-12 ENCOUNTER — Ambulatory Visit: Payer: Medicare PPO | Admitting: Podiatry

## 2020-08-12 ENCOUNTER — Other Ambulatory Visit: Payer: Self-pay

## 2020-08-12 DIAGNOSIS — M79676 Pain in unspecified toe(s): Secondary | ICD-10-CM

## 2020-08-12 DIAGNOSIS — E0843 Diabetes mellitus due to underlying condition with diabetic autonomic (poly)neuropathy: Secondary | ICD-10-CM | POA: Diagnosis not present

## 2020-08-12 DIAGNOSIS — B351 Tinea unguium: Secondary | ICD-10-CM

## 2020-08-12 NOTE — Progress Notes (Signed)
This patient returns to my office for at risk foot care.  This patient requires this care by a professional since this patient will be at risk due to having diabetes.  This patient is unable to cut nails herself since the patient cannot reach her nails.These nails are painful walking and wearing shoes.  This patient presents for at risk foot care today. She presents to the office with her daughter.  General Appearance  Alert, conversant and in no acute stress.  Vascular  Dorsalis pedis and posterior tibial  pulses are palpable  bilaterally.  Capillary return is within normal limits  bilaterally. Temperature is within normal limits  bilaterally.  Neurologic  Senn-Weinstein monofilament wire test within normal limits  bilaterally. Muscle power within normal limits bilaterally.  Nails Thick disfigured discolored nails with subungual debris  from hallux to fifth toes bilaterally. No evidence of bacterial infection or drainage bilaterally.  Orthopedic  No limitations of motion  feet .  No crepitus or effusions noted.  No bony pathology or digital deformities noted.  Skin  normotropic skin with no porokeratosis noted bilaterally.  No signs of infections or ulcers noted.     Onychomycosis  Pain in right toes  Pain in left toes  Consent was obtained for treatment procedures.   Mechanical debridement of nails 1-5  bilaterally performed with a nail nipper.  Filed with dremel without incident.    Return office visit   3 months                   Told patient to return for periodic foot care and evaluation due to potential at risk complications.   Gardiner Barefoot DPM

## 2020-11-12 ENCOUNTER — Ambulatory Visit: Payer: Medicare PPO | Admitting: Podiatry

## 2020-11-14 ENCOUNTER — Other Ambulatory Visit: Payer: Self-pay

## 2020-11-14 ENCOUNTER — Ambulatory Visit: Payer: Medicare PPO | Admitting: Podiatry

## 2020-11-14 ENCOUNTER — Encounter: Payer: Self-pay | Admitting: Podiatry

## 2020-11-14 DIAGNOSIS — B351 Tinea unguium: Secondary | ICD-10-CM | POA: Diagnosis not present

## 2020-11-14 DIAGNOSIS — M79676 Pain in unspecified toe(s): Secondary | ICD-10-CM

## 2020-11-14 DIAGNOSIS — E0843 Diabetes mellitus due to underlying condition with diabetic autonomic (poly)neuropathy: Secondary | ICD-10-CM

## 2020-11-14 NOTE — Progress Notes (Signed)
This patient returns to my office for at risk foot care.  This patient requires this care by a professional since this patient will be at risk due to having diabetes.  This patient is unable to cut nails herself since the patient cannot reach her nails.These nails are painful walking and wearing shoes.  This patient presents for at risk foot care today. She presents to the office with her daughter.  General Appearance  Alert, conversant and in no acute stress.  Vascular  Dorsalis pedis   pulses are palpable  Bilaterally.  Posterior tibial pulses are absent.  Capillary return is within normal limits  bilaterally. Cold feet.   Bilaterally. Absent digital hair.  Neurologic  Senn-Weinstein monofilament wire test within normal limits  bilaterally. Muscle power within normal limits bilaterally.  Nails Thick disfigured discolored nails with subungual debris  from hallux to fifth toes bilaterally. No evidence of bacterial infection or drainage bilaterally.  Orthopedic  No limitations of motion  feet .  No crepitus or effusions noted.  Adducto-varus 4,5  B/L.  Skin  Thin  skin with no porokeratosis noted bilaterally.  No signs of infections or ulcers noted.     Onychomycosis  Pain in right toes  Pain in left toes  Consent was obtained for treatment procedures.   Mechanical debridement of nails 1-5  bilaterally performed with a nail nipper.  Filed with dremel without incident.    Return office visit   3 months                   Told patient to return for periodic foot care and evaluation due to potential at risk complications.   Royal Vandevoort DPM  

## 2021-02-24 ENCOUNTER — Ambulatory Visit: Payer: Medicare PPO | Admitting: Podiatry

## 2021-02-24 ENCOUNTER — Encounter: Payer: Self-pay | Admitting: Podiatry

## 2021-02-24 ENCOUNTER — Other Ambulatory Visit: Payer: Self-pay

## 2021-02-24 DIAGNOSIS — E0843 Diabetes mellitus due to underlying condition with diabetic autonomic (poly)neuropathy: Secondary | ICD-10-CM

## 2021-02-24 DIAGNOSIS — B351 Tinea unguium: Secondary | ICD-10-CM

## 2021-02-24 DIAGNOSIS — M79676 Pain in unspecified toe(s): Secondary | ICD-10-CM | POA: Diagnosis not present

## 2021-02-24 NOTE — Progress Notes (Signed)
This patient returns to my office for at risk foot care.  This patient requires this care by a professional since this patient will be at risk due to having diabetes.  This patient is unable to cut nails herself since the patient cannot reach her nails.These nails are painful walking and wearing shoes.  This patient presents for at risk foot care today. She presents to the office with her daughter.  General Appearance  Alert, conversant and in no acute stress.  Vascular  Dorsalis pedis   pulses are palpable  Bilaterally.  Posterior tibial pulses are absent.  Capillary return is within normal limits  bilaterally. Cold feet.   Bilaterally. Absent digital hair.  Neurologic  Senn-Weinstein monofilament wire test within normal limits  bilaterally. Muscle power within normal limits bilaterally.  Nails Thick disfigured discolored nails with subungual debris  from hallux to fifth toes bilaterally. No evidence of bacterial infection or drainage bilaterally.  Orthopedic  No limitations of motion  feet .  No crepitus or effusions noted.  Adducto-varus 4,5  B/L.  Skin  Thin  skin with no porokeratosis noted bilaterally.  No signs of infections or ulcers noted.     Onychomycosis  Pain in right toes  Pain in left toes  Consent was obtained for treatment procedures.   Mechanical debridement of nails 1-5  bilaterally performed with a nail nipper.  Filed with dremel without incident.    Return office visit   3 months                   Told patient to return for periodic foot care and evaluation due to potential at risk complications.   Gardiner Barefoot DPM

## 2021-06-02 ENCOUNTER — Ambulatory Visit (INDEPENDENT_AMBULATORY_CARE_PROVIDER_SITE_OTHER): Payer: Medicare PPO | Admitting: Podiatry

## 2021-06-02 ENCOUNTER — Encounter: Payer: Self-pay | Admitting: Podiatry

## 2021-06-02 ENCOUNTER — Other Ambulatory Visit: Payer: Self-pay

## 2021-06-02 DIAGNOSIS — B351 Tinea unguium: Secondary | ICD-10-CM

## 2021-06-02 DIAGNOSIS — M79676 Pain in unspecified toe(s): Secondary | ICD-10-CM | POA: Diagnosis not present

## 2021-06-02 DIAGNOSIS — E0843 Diabetes mellitus due to underlying condition with diabetic autonomic (poly)neuropathy: Secondary | ICD-10-CM

## 2021-06-02 NOTE — Progress Notes (Signed)
This patient returns to my office for at risk foot care.  This patient requires this care by a professional since this patient will be at risk due to having diabetes.  This patient is unable to cut nails herself since the patient cannot reach her nails.These nails are painful walking and wearing shoes.   Patient says she has callus pain on her 4th toe right foot. This patient presents for at risk foot care today. She presents to the office with her daughter.  General Appearance  Alert, conversant and in no acute stress.  Vascular  Dorsalis pedis   pulses are palpable  Bilaterally.  Posterior tibial pulses are absent.  Capillary return is within normal limits  bilaterally. Cold feet.   Bilaterally. Absent digital hair.  Neurologic  Senn-Weinstein monofilament wire test within normal limits  bilaterally. Muscle power within normal limits bilaterally.  Nails Thick disfigured discolored nails with subungual debris  from hallux to fifth toes bilaterally. No evidence of bacterial infection or drainage bilaterally.  Orthopedic  No limitations of motion  feet .  No crepitus or effusions noted.  Adducto-varus 4,5  B/L.  Mallet toe 2,3,4 right  Skin  Thin  skin with no porokeratosis noted bilaterally.  No signs of infections or ulcers noted.   Corn 4th toe right foot.  Onychomycosis  Pain in right toes  Pain in left toes  Callus fourth toe right foot.  Consent was obtained for treatment procedures.   Mechanical debridement of nails 1-5  bilaterally performed with a nail nipper.  Filed with dremel without incident. Debride corn with # 15 blade.  Padding dispensed.   Return office visit   3 months                   Told patient to return for periodic foot care and evaluation due to potential at risk complications.   Gardiner Barefoot DPM

## 2021-06-28 ENCOUNTER — Emergency Department (HOSPITAL_BASED_OUTPATIENT_CLINIC_OR_DEPARTMENT_OTHER)
Admission: EM | Admit: 2021-06-28 | Discharge: 2021-06-29 | Disposition: A | Discharge status: Home / Self Care | Payer: Medicare PPO | Attending: Emergency Medicine | Admitting: Emergency Medicine

## 2021-06-28 ENCOUNTER — Other Ambulatory Visit: Payer: Self-pay

## 2021-06-28 ENCOUNTER — Emergency Department (HOSPITAL_BASED_OUTPATIENT_CLINIC_OR_DEPARTMENT_OTHER): Payer: Medicare PPO

## 2021-06-28 ENCOUNTER — Encounter (HOSPITAL_BASED_OUTPATIENT_CLINIC_OR_DEPARTMENT_OTHER): Payer: Self-pay | Admitting: *Deleted

## 2021-06-28 DIAGNOSIS — Z7982 Long term (current) use of aspirin: Secondary | ICD-10-CM | POA: Insufficient documentation

## 2021-06-28 DIAGNOSIS — E119 Type 2 diabetes mellitus without complications: Secondary | ICD-10-CM | POA: Diagnosis not present

## 2021-06-28 DIAGNOSIS — Z7951 Long term (current) use of inhaled steroids: Secondary | ICD-10-CM | POA: Insufficient documentation

## 2021-06-28 DIAGNOSIS — Z79899 Other long term (current) drug therapy: Secondary | ICD-10-CM | POA: Insufficient documentation

## 2021-06-28 DIAGNOSIS — S22088A Other fracture of T11-T12 vertebra, initial encounter for closed fracture: Secondary | ICD-10-CM | POA: Insufficient documentation

## 2021-06-28 DIAGNOSIS — I110 Hypertensive heart disease with heart failure: Secondary | ICD-10-CM | POA: Insufficient documentation

## 2021-06-28 DIAGNOSIS — Z7984 Long term (current) use of oral hypoglycemic drugs: Secondary | ICD-10-CM | POA: Diagnosis not present

## 2021-06-28 DIAGNOSIS — S3992XA Unspecified injury of lower back, initial encounter: Secondary | ICD-10-CM | POA: Diagnosis present

## 2021-06-28 DIAGNOSIS — J45909 Unspecified asthma, uncomplicated: Secondary | ICD-10-CM | POA: Insufficient documentation

## 2021-06-28 DIAGNOSIS — J449 Chronic obstructive pulmonary disease, unspecified: Secondary | ICD-10-CM | POA: Insufficient documentation

## 2021-06-28 DIAGNOSIS — I5042 Chronic combined systolic (congestive) and diastolic (congestive) heart failure: Secondary | ICD-10-CM | POA: Insufficient documentation

## 2021-06-28 DIAGNOSIS — Z96642 Presence of left artificial hip joint: Secondary | ICD-10-CM | POA: Insufficient documentation

## 2021-06-28 DIAGNOSIS — W19XXXA Unspecified fall, initial encounter: Secondary | ICD-10-CM

## 2021-06-28 DIAGNOSIS — W100XXA Fall (on)(from) escalator, initial encounter: Secondary | ICD-10-CM | POA: Diagnosis not present

## 2021-06-28 DIAGNOSIS — S22080A Wedge compression fracture of T11-T12 vertebra, initial encounter for closed fracture: Secondary | ICD-10-CM

## 2021-06-28 LAB — COMPREHENSIVE METABOLIC PANEL
ALT: 24 U/L (ref 0–44)
AST: 34 U/L (ref 15–41)
Albumin: 4.4 g/dL (ref 3.5–5.0)
Alkaline Phosphatase: 102 U/L (ref 38–126)
Anion gap: 9 (ref 5–15)
BUN: 16 mg/dL (ref 8–23)
CO2: 26 mmol/L (ref 22–32)
Calcium: 9.2 mg/dL (ref 8.9–10.3)
Chloride: 103 mmol/L (ref 98–111)
Creatinine, Ser: 0.88 mg/dL (ref 0.44–1.00)
GFR, Estimated: >60 mL/min (ref >60)
Glucose, Bld: 111 mg/dL — ABNORMAL HIGH (ref 70–99)
Potassium: 3.9 mmol/L (ref 3.5–5.1)
Sodium: 138 mmol/L (ref 135–145)
Total Bilirubin: 0.8 mg/dL (ref 0.3–1.2)
Total Protein: 7.6 g/dL (ref 6.5–8.1)

## 2021-06-28 LAB — URINALYSIS, ROUTINE W REFLEX MICROSCOPIC
Bilirubin Urine: NEGATIVE
Glucose, UA: NEGATIVE mg/dL
Hgb urine dipstick: NEGATIVE
Ketones, ur: NEGATIVE mg/dL
Leukocytes,Ua: NEGATIVE
Nitrite: NEGATIVE
Protein, ur: NEGATIVE mg/dL
Specific Gravity, Urine: 1.015 (ref 1.005–1.030)
pH: 7 (ref 5.0–8.0)

## 2021-06-28 LAB — CBC WITH DIFFERENTIAL/PLATELET
Abs Immature Granulocytes: 0.05 10*3/uL (ref 0.00–0.07)
Basophils Absolute: 0 10*3/uL (ref 0.0–0.1)
Basophils Relative: 0 %
Eosinophils Absolute: 0.9 10*3/uL — ABNORMAL HIGH (ref 0.0–0.5)
Eosinophils Relative: 10 %
HCT: 37.6 % (ref 36.0–46.0)
Hemoglobin: 12.5 g/dL (ref 12.0–15.0)
Immature Granulocytes: 1 %
Lymphocytes Relative: 17 %
Lymphs Abs: 1.5 10*3/uL (ref 0.7–4.0)
MCH: 27.7 pg (ref 26.0–34.0)
MCHC: 33.2 g/dL (ref 30.0–36.0)
MCV: 83.2 fL (ref 80.0–100.0)
Monocytes Absolute: 0.8 10*3/uL (ref 0.1–1.0)
Monocytes Relative: 9 %
Neutro Abs: 5.6 10*3/uL (ref 1.7–7.7)
Neutrophils Relative %: 63 %
Platelets: 130 10*3/uL — ABNORMAL LOW (ref 150–400)
RBC: 4.52 MIL/uL (ref 3.87–5.11)
RDW: 14.6 % (ref 11.5–15.5)
WBC: 8.7 10*3/uL (ref 4.0–10.5)
nRBC: 0 % (ref 0.0–0.2)

## 2021-06-28 MED ORDER — LIDOCAINE 5 % EX PTCH: 1.0000 | MEDICATED_PATCH | CUTANEOUS | 0 refills | Status: AC

## 2021-06-28 MED ORDER — CYCLOBENZAPRINE HCL 5 MG PO TABS
5.0000 mg | ORAL_TABLET | Freq: Once | ORAL | Status: DC
  Filled 2021-06-28: qty 1

## 2021-06-28 MED ORDER — CYCLOBENZAPRINE HCL 5 MG PO TABS: 10.0000 mg | ORAL_TABLET | Freq: Two times a day (BID) | ORAL | 0 refills | Status: AC | PRN

## 2021-06-28 NOTE — Discharge Instructions (Addendum)
Your history, exam, work-up today are consistent with a single compression fracture in your lower thoracic spine at T12 with 10% height loss.  This is near your previous lumbar compression fractures which appear unchanged today.  No other acute injuries were discovered and you had a reassuring exam with normal sensation, strength, and pulses in lower extremities.  He did not have any loss of bowel or bladder control and were able to sit up and carefully stand today.  As we discussed, please use the muscle relaxant and the Lidoderm patches to help with her discomfort and be careful not to have another fall.  Please call your back doctor and PCP for reassessment and further management.  Your other labs were reassuring no evidence of urinary tract infection today.  If you develop any new symptoms or the pain is uncontrolled, please return to your primary doctor or the nearest emergency department for reevaluation.

## 2021-06-28 NOTE — ED Provider Notes (Signed)
[facility] Provider Note   CSN: [id number] Arrival date & time: 06/28/21  1902     History Chief Complaint  Patient presents with   Samantha Hutchinson is a 70 y.o. female.  The history is provided by the patient, a relative and medical records. No language interpreter was used.  Fall This is a recurrent problem. The current episode started 3 to 5 hours ago. The problem occurs rarely. The problem has been rapidly improving. Pertinent negatives include no chest pain, no abdominal pain, no headaches and no shortness of breath. Nothing aggravates the symptoms. Nothing relieves the symptoms. She has tried nothing for the symptoms. The treatment provided no relief.      Past Medical History:  Diagnosis Date   Asthma    Colon polyp    COPD (chronic obstructive pulmonary disease) ([place])    Diabetes ([place])    Fall    Hip fracture ([place])    HLD (hyperlipidemia)    HTN (hypertension)    Muscle weakness    Wrist fracture     Patient Active Problem List   Diagnosis Date Noted   Acute respiratory failure with hypoxia ([place]) 04/18/2018   Chronic combined systolic and diastolic heart failure ([place]) 03/02/2018   Hypertensive heart disease with congestive heart failure ([place]) 03/02/2018   Dyslipidemia associated with type 2 diabetes mellitus ([place]) 03/02/2018   Insulin dependent type 2 diabetes mellitus, uncontrolled ([place]) 03/02/2018   Vascular dementia without behavioral disturbance ([place]) 03/02/2018   Acetabular fracture ([place]) 02/25/2018   Wrist fracture 02/25/2018   Klebsiella cystitis 02/25/2018   Acute on chronic congestive heart failure ([place]) 02/25/2018   Anxiety 02/25/2018   Diabetes ([place]) 02/25/2018   COPD (chronic obstructive pulmonary disease) ([place]) 02/25/2018   HLD (hyperlipidemia) 02/25/2018   Depression, major, in remission ([place]) 02/25/2018    Past Surgical History:  Procedure Laterality Date   CHOLECYSTECTOMY     DILATION AND CURETTAGE  OF UTERUS     ICD IMPLANT     JOINT REPLACEMENT     TOTAL ABDOMINAL HYSTERECTOMY  1980   TOTAL HIP ARTHROPLASTY Left      OB History   No obstetric history on file.     Family History  Problem Relation Age of Onset   AAA (abdominal aortic aneurysm) Other     Social History   Tobacco Use   Smoking status: Never   Smokeless tobacco: Never  Vaping Use   Vaping Use: Never used  Substance Use Topics   Alcohol use: Not Currently   Drug use: Never    Home Medications Prior to Admission medications   Medication Sig Start Date End Date Taking? Authorizing Provider  acetaminophen (TYLENOL) 500 MG tablet Take 500 mg by mouth every 8 (eight) hours as needed.    [provider]  albuterol (PROVENTIL HFA;VENTOLIN HFA) 108 (90 Base) MCG/ACT inhaler Inhale 2 puffs into the lungs every 6 (six) hours as needed for wheezing or shortness of breath.    [provider]  ALPRAZolam Duanne Moron) 0.25 MG tablet Take 0.25 mg by mouth 2 (two) times daily.     [provider]  aspirin EC 81 MG tablet Take 81 mg by mouth daily.    [provider]  atorvastatin (LIPITOR) 20 MG tablet Take 20 mg by mouth daily.     [provider]  busPIRone (BUSPAR) 5 MG tablet Take 5 mg by mouth 2 (two) times daily. 02/12/20   [provider]  calcium citrate-vitamin D (CITRACAL+D) 315-200 MG-UNIT tablet Take 1 tablet by mouth daily.    [provider]  carvedilol (COREG) 6.25 MG tablet Take 6.25 mg by mouth 2 (two) times daily with a meal.    [provider]  donepezil (ARICEPT) 10 MG tablet Take 10 mg by mouth at bedtime. For Vascular Dementia    [provider]  FERREX 150 150 MG capsule Take 150 mg by mouth daily. 04/14/20   [provider]  fluticasone furoate-vilanterol (BREO ELLIPTA) 200-25 MCG/INH AEPB Inhale 1 puff into the lungs daily.    [provider]  furosemide (LASIX) 20 MG tablet Take 20 mg by mouth daily as  needed. For weight gain > 3lbs    [provider]  JANUVIA 50 MG tablet Take 50 mg by mouth daily. 04/17/20   [provider]  losartan (COZAAR) 50 MG tablet Take 50 mg by mouth daily.     [provider]  magnesium oxide (MAG-OX) 400 MG tablet Take 1 tablet by mouth 3 (three) times daily. 02/12/20   [provider]  memantine (NAMENDA XR) 28 MG CP24 24 hr capsule Take 28 mg by mouth daily.    [provider]  metFORMIN (GLUCOPHAGE) 500 MG tablet Take 500 mg by mouth 2 (two) times daily with a meal.     [provider]  pantoprazole (PROTONIX) 40 MG tablet Take 40 mg by mouth daily.     [provider]  potassium chloride SA (K-DUR,KLOR-CON) 20 MEQ tablet Take 20 mEq by mouth daily.    [provider]  sertraline (ZOLOFT) 100 MG tablet Take 200 mg by mouth daily.     [provider]    Allergies    Azithromycin, Codeine, Demerol [meperidine hcl], Erythromycin, Penicillins, Sulfa antibiotics, and Sulfamethizole  Review of Systems   Review of Systems  Constitutional:  Negative for chills, diaphoresis, fatigue and fever.  HENT:  Negative for congestion.   Eyes:  Negative for visual disturbance.  Respiratory:  Negative for cough, chest tightness, shortness of breath and wheezing.   Cardiovascular:  Negative for chest pain, palpitations and leg swelling.  Gastrointestinal:  Negative for abdominal pain, constipation, diarrhea, nausea and vomiting.  Genitourinary:  Negative for difficulty urinating, dysuria, flank pain and frequency.  Musculoskeletal:  Positive for back pain and gait problem (per family slightly more unsteady similar to when she has UTI). Negative for neck pain and neck stiffness.  Skin:  Negative for rash and wound.  Neurological:  Negative for dizziness, speech difficulty, weakness, light-headedness, numbness and headaches.  Psychiatric/Behavioral:  Negative for agitation and confusion.   All other  systems reviewed and are negative.  Physical Exam Updated Vital Signs BP (!) 168/79 (BP Location: Left Arm)   Pulse 90   Temp 98 F (36.7 C)   Resp 20   Ht '5\' 7"'$  (1.702 m)   Wt 61.2 kg   SpO2 95%   BMI 21.14 kg/m   Physical Exam Vitals and nursing note reviewed.  Constitutional:      General: She is not in acute distress.    Appearance: She is well-developed. She is not ill-appearing, toxic-appearing or diaphoretic.  HENT:     Head: Normocephalic and atraumatic.     Nose: No congestion or rhinorrhea.     Mouth/Throat:     Mouth: Mucous membranes are moist.     Pharynx: No oropharyngeal exudate.  Eyes:     Extraocular Movements: Extraocular movements  intact.     Conjunctiva/sclera: Conjunctivae normal.     Pupils: Pupils are equal, round, and reactive to light.  Cardiovascular:     Rate and Rhythm: Normal rate and regular rhythm.     Pulses: Normal pulses.     Heart sounds: No murmur heard. Pulmonary:     Effort: Pulmonary effort is normal. No respiratory distress.     Breath sounds: Normal breath sounds. No wheezing, rhonchi or rales.  Chest:     Chest wall: No tenderness.  Abdominal:     General: Abdomen is flat.     Palpations: Abdomen is soft.     Tenderness: There is no abdominal tenderness. There is no right CVA tenderness, left CVA tenderness, guarding or rebound.  Musculoskeletal:        General: Tenderness and signs of injury present.     Cervical back: Neck supple. No tenderness.     Thoracic back: No tenderness.     Lumbar back: Spasms and tenderness present.       Back:     Right lower leg: No edema.     Left lower leg: No edema.  Skin:    General: Skin is warm and dry.     Capillary Refill: Capillary refill takes less than 2 seconds.     Findings: No erythema or rash.  Neurological:     General: No focal deficit present.     Mental Status: She is alert. Mental status is at baseline.     Cranial Nerves: No cranial nerve deficit.     Sensory: No  sensory deficit.     Motor: No weakness.  Psychiatric:        Mood and Affect: Mood normal.    ED Results / Procedures / Treatments   Labs (all labs ordered are listed, but only abnormal results are displayed) Labs Reviewed  CBC WITH DIFFERENTIAL/PLATELET - Abnormal; Notable for the following components:      Result Value   Platelets 130 (*)    Eosinophils Absolute 0.9 (*)    All other components within normal limits  COMPREHENSIVE METABOLIC PANEL - Abnormal; Notable for the following components:   Glucose, Bld 111 (*)    All other components within normal limits  URINE CULTURE  URINALYSIS, ROUTINE W REFLEX MICROSCOPIC    EKG None  Radiology CT Lumbar Spine Wo Contrast  Result Date: 06/28/2021 CLINICAL DATA:  Initial evaluation for low back pain, trauma. EXAM: CT LUMBAR SPINE WITHOUT CONTRAST TECHNIQUE: Multidetector CT imaging of the lumbar spine was performed without intravenous contrast administration. Multiplanar CT image reconstructions were also generated. COMPARISON:  Prior study from 05/22/2020. FINDINGS: Segmentation: Standard. Lowest well-formed disc space labeled the L5-S1 level. Alignment: Physiologic with preservation of the normal lumbar lordosis. No listhesis. Vertebrae: Acute compression fracture involving the superior endplate of 624THL with mild 10% height loss and trace 2 mm bony retropulsion. No significant stenosis. This is benign/mechanical in appearance. Severe compression deformity involving the L1 vertebral body with 4 mm bony retropulsion, chronic. Additional compression deformity involving the L3 vertebral body with 50% height loss and 4 mm bony retropulsion, progressed from previous, but also likely chronic. Vertebral body height otherwise maintained. Visualized sacrum and pelvis intact. No worrisome osseous lesions. 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 Paraspinal and other soft tissues: Paraspinous  soft tissues demonstrate no other acute finding. Advanced aortic atherosclerosis. Prior cholecystectomy. Splenomegaly. Disc levels: T12-L1: 4 mm bony retropulsion related  to the chronic L1 compression fracture. No significant spinal stenosis. Foramina remain patent. L1-2:  Unremarkable. L2-3: Mild disc bulge with 2 mm bony retropulsion related to the chronic L3 compression fracture. Mild facet hypertrophy. No spinal stenosis. Mild left foraminal narrowing. L3-4: Mild facet hypertrophy. Otherwise unremarkable. No stenosis. L4-5:  Minimal disc bulge with mild facet hypertrophy.  No stenosis. L5-S1: Mild disc bulge with bilateral facet hypertrophy. No stenosis. IMPRESSION: 1. Acute compression fracture involving the superior endplate of 624THL with mild 10% height loss and trace 2 mm bony retropulsion. No significant stenosis. 2. Chronic compression fractures involving the L1 and L3 vertebral bodies as above. 3. 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 4. Splenomegaly. 5. Aortic Atherosclerosis (ICD10-I70.0). Electronically Signed   By: Jeannine Boga M.D.   On: 06/28/2021 21:58    Procedures Procedures   Medications Ordered in ED Medications  cyclobenzaprine (FLEXERIL) tablet 5 mg (5 mg Oral Patient Refused/Not Given 06/28/21 2348)    ED Course  I have reviewed the triage vital signs and the nursing notes.  Pertinent labs & imaging results that were available during my care of the patient were reviewed by me and considered in my medical decision making (see chart for details).    MDM Rules/Calculators/A&P                           Samantha Hutchinson is a 70 y.o. female with a past medical history significant for diabetes, COPD, CHF, vascular dementia, previous compression fractures in lumbar spine, and anxiety who presents with a fall.  Patient reports that she had a mechanical fall today on the elevator when a another resident at her facility caused her to trip  and fall.  She did not hit her head and denies loss of consciousness.  She reports pain in her low back similar to previous back fracture.  She denies any loss of bowel or bladder control, numbness, tingling, weakness of the legs.  She is able to stand reportedly.  She denies any headache, neck pain, or torso pain otherwise.  Denies nausea, vomiting, or other complaints.  Daughter reports that she thought patient was slightly unsteady yesterday similar to when she had UTI in the past.  Otherwise she has not had any other infectious symptoms.  No focal neurologic deficits reported.  On exam, patient did have some tenderness in her lumbar spine but otherwise had no focal neurologic deficits distally.  Normal sensation and strength in legs.  Good pulses in lower extremities.  No abdominal tenderness or chest tenderness.  No numbness and lower extremities.  Bruising without any tenderness in the right arm which she is not concerned about.  Clinically I do feel patient needs to have a CT of her lumbar spine to make sure there are no new compression fractures or new injury.  She did have some muscle spasms on exam with lumbar tenderness which is concerning for muscle spasm and likely reaggravated previous injury.  She did not have any loss of bowel or bladder control or any other red flags for cord compression at this time.  Patient was able to sit up and ambulate to the bedside commode without significant difficulty.    CT returned showing a single 10% superior endplate compression fracture of T12.  This is near her L1 and L3 injuries in the past.  No other acute injuries discovered.  No significant stenosis seen.  Otherwise her  labs and urinalysis did not show acute infection or significant abnormalities.  Had a shared decision made conversation with patient and daughter and they agree that they do not feel she needs transfer for MRI or evaluation by PT for placement at this time as she was able to sit up and  safely ambulate to the commode however they do want to see what medication we can give her to help with her discomfort.  We will give prescription for Lidoderm patches and a low-dose of muscle relaxant given the muscle spasms on exam to see if this helps.  She will follow-up with her back doctor and PCP.  We discussed strict return precautions for any new or worsened symptoms and instructed her on being careful not to have any further falls.  Family agrees with plan of care at this time and agrees that if she starts to have worsening ambulation she needs to go to the emergency department or see a PCP to discuss placement and further management.  They will call the back doctor and they do report they have a back brace at home that she may try to see if it helps with discomfort.  Patient and family agree with plan of care and patient discharged in good condition.   Final Clinical Impression(s) / ED Diagnoses Final diagnoses:  Fall, initial encounter  Compression fracture of T12 vertebra, initial encounter ([place])    Rx / DC Orders ED Discharge Orders          Ordered    cyclobenzaprine (FLEXERIL) 5 MG tablet  2 times daily PRN        06/28/21 2325    lidocaine (LIDODERM) 5 %  Every 24 hours        06/28/21 2325            Clinical Impression: 1. Fall, initial encounter   2. Compression fracture of T12 vertebra, initial encounter ([place])     Disposition: Discharge  Condition: Good  I have discussed the results, Dx and Tx plan with the pt(& family if present). He/she/they expressed understanding and agree(s) with the plan. Discharge instructions discussed at great length. Strict return precautions discussed and pt &/or family have verbalized understanding of the instructions. No further questions at time of discharge.    New Prescriptions   CYCLOBENZAPRINE (FLEXERIL) 5 MG TABLET    Take 2 tablets (10 mg total) by mouth 2 (two) times daily as needed for muscle spasms.   LIDOCAINE  (LIDODERM) 5 %    Place 1 patch onto the skin daily. Remove & Discard patch within 12 hours or as directed by MD    Follow Up: your back doctor     Hennie Duos, MD [place] [address] [phone]     [facility] [address] [id number] [place]       Faythe Heitzenrater, Gwenyth Allegra, MD 06/29/21 0005

## 2021-06-28 NOTE — ED Notes (Addendum)
Assisted patient to bedside commode; unable to void at this time. Patient had wet brief on. Brief changed and new one applied. Dark purple bruising noted to right buttocks area.

## 2021-06-28 NOTE — ED Notes (Signed)
Patient transported to CT 

## 2021-06-28 NOTE — ED Triage Notes (Signed)
Pt lives at the [facility]. Her daughter states yesterday she noticed her mother limping. Today she received a call that her mother had fallen on the elevator. Pt brought in by EMS. Alert, c/o low back pain

## 2021-06-30 LAB — URINE CULTURE: Culture: NO GROWTH

## 2021-09-15 ENCOUNTER — Ambulatory Visit: Payer: Medicare PPO | Admitting: Podiatry

## 2021-12-30 ENCOUNTER — Other Ambulatory Visit: Payer: Self-pay

## 2021-12-30 ENCOUNTER — Encounter: Payer: Self-pay | Admitting: Podiatry

## 2021-12-30 ENCOUNTER — Ambulatory Visit: Payer: Medicare PPO | Admitting: Podiatry

## 2021-12-30 DIAGNOSIS — B351 Tinea unguium: Secondary | ICD-10-CM

## 2021-12-30 DIAGNOSIS — M79676 Pain in unspecified toe(s): Secondary | ICD-10-CM | POA: Diagnosis not present

## 2021-12-30 DIAGNOSIS — E0843 Diabetes mellitus due to underlying condition with diabetic autonomic (poly)neuropathy: Secondary | ICD-10-CM

## 2021-12-30 NOTE — Progress Notes (Signed)
This patient returns to my office for at risk foot care.  This patient requires this care by a professional since this patient will be at risk due to having diabetes.  This patient is unable to cut nails herself since the patient cannot reach her nails.These nails are painful walking and wearing shoes.   This patient presents for at risk foot care today. She presents to the office with her daughter.  General Appearance  Alert, conversant and in no acute stress.  Vascular  Dorsalis pedis   pulses are  weakly palpable  Bilaterally.  Posterior tibial pulses are absent.  Capillary return is within normal limits  bilaterally. Cold feet.   Bilaterally. Absent digital hair.  Neurologic  Senn-Weinstein monofilament wire test within normal limits  bilaterally. Muscle power within normal limits bilaterally.  Nails Thick disfigured discolored nails with subungual debris  from hallux to fifth toes bilaterally. No evidence of bacterial infection or drainage bilaterally.  Orthopedic  No limitations of motion  feet .  No crepitus or effusions noted.  Adducto-varus 4,5  B/L.  Mallet toe 2,3,4 right  Skin  Thin  skin with no porokeratosis noted bilaterally.  No signs of infections or ulcers noted.   Corn 4th toe right foot.  Onychomycosis  Pain in right toes  Pain in left toes  Callus fourth toe right foot.  Consent was obtained for treatment procedures.   Mechanical debridement of nails 1-5  bilaterally performed with a nail nipper.  Filed with dremel without incident. Debride corn with # 15 blade.  Padding dispensed.   Return office visit   3 months                   Told patient to return for periodic foot care and evaluation due to potential at risk complications.   Gardiner Barefoot DPM

## 2022-04-07 ENCOUNTER — Encounter: Payer: Self-pay | Admitting: Podiatry

## 2022-04-07 ENCOUNTER — Ambulatory Visit: Payer: Medicare PPO | Admitting: Podiatry

## 2022-04-07 DIAGNOSIS — M79676 Pain in unspecified toe(s): Secondary | ICD-10-CM | POA: Diagnosis not present

## 2022-04-07 DIAGNOSIS — E0843 Diabetes mellitus due to underlying condition with diabetic autonomic (poly)neuropathy: Secondary | ICD-10-CM | POA: Diagnosis not present

## 2022-04-07 DIAGNOSIS — B351 Tinea unguium: Secondary | ICD-10-CM | POA: Diagnosis not present

## 2022-04-07 NOTE — Progress Notes (Signed)
This patient returns to my office for at risk foot care.  This patient requires this care by a professional since this patient will be at risk due to having diabetes.  This patient is unable to cut nails herself since the patient cannot reach her nails.These nails are painful walking and wearing shoes.   This patient presents for at risk foot care today. She presents to the office with her daughter. ? ?General Appearance  Alert, conversant and in no acute stress. ? ?Vascular  Dorsalis pedis   pulses are  weakly palpable  Bilaterally.  Posterior tibial pulses are absent.  Capillary return is within normal limits  bilaterally. Cold feet.   Bilaterally. Absent digital hair. ? ?Neurologic  Senn-Weinstein monofilament wire test within normal limits  bilaterally. Muscle power within normal limits bilaterally. ? ?Nails Thick disfigured discolored nails with subungual debris  from hallux to fifth toes bilaterally. No evidence of bacterial infection or drainage bilaterally. ? ?Orthopedic  No limitations of motion  feet .  No crepitus or effusions noted.  Adducto-varus 4,5  B/L.  Mallet toe 2,3,4 right ? ?Skin  Thin  skin with no porokeratosis noted bilaterally.  No signs of infections or ulcers noted.   Corn 4th toe right foot. ? ?Onychomycosis  Pain in right toes  Pain in left toes  Callus fourth toe right foot. ? ?Consent was obtained for treatment procedures.   Mechanical debridement of nails 1-5  bilaterally performed with a nail nipper.  Filed with dremel without incident. Debride corn with # 15 blade.  Padding dispensed. ? ? ?Return office visit   3 months                   Told patient to return for periodic foot care and evaluation due to potential at risk complications. ? ? ?Gardiner Barefoot DPM  ?

## 2022-04-19 ENCOUNTER — Encounter (HOSPITAL_BASED_OUTPATIENT_CLINIC_OR_DEPARTMENT_OTHER): Payer: Self-pay

## 2022-04-19 ENCOUNTER — Emergency Department (HOSPITAL_BASED_OUTPATIENT_CLINIC_OR_DEPARTMENT_OTHER)
Admission: EM | Admit: 2022-04-19 | Discharge: 2022-04-19 | Disposition: A | Discharge status: Home / Self Care | Payer: Medicare PPO | Attending: Emergency Medicine | Admitting: Emergency Medicine

## 2022-04-19 ENCOUNTER — Emergency Department (HOSPITAL_BASED_OUTPATIENT_CLINIC_OR_DEPARTMENT_OTHER): Payer: Medicare PPO

## 2022-04-19 ENCOUNTER — Other Ambulatory Visit: Payer: Self-pay

## 2022-04-19 DIAGNOSIS — W06XXXA Fall from bed, initial encounter: Secondary | ICD-10-CM | POA: Insufficient documentation

## 2022-04-19 DIAGNOSIS — Z7951 Long term (current) use of inhaled steroids: Secondary | ICD-10-CM | POA: Insufficient documentation

## 2022-04-19 DIAGNOSIS — W01198A Fall on same level from slipping, tripping and stumbling with subsequent striking against other object, initial encounter: Secondary | ICD-10-CM | POA: Insufficient documentation

## 2022-04-19 DIAGNOSIS — J449 Chronic obstructive pulmonary disease, unspecified: Secondary | ICD-10-CM | POA: Diagnosis not present

## 2022-04-19 DIAGNOSIS — Z96641 Presence of right artificial hip joint: Secondary | ICD-10-CM | POA: Diagnosis not present

## 2022-04-19 DIAGNOSIS — I110 Hypertensive heart disease with heart failure: Secondary | ICD-10-CM | POA: Diagnosis not present

## 2022-04-19 DIAGNOSIS — S300XXD Contusion of lower back and pelvis, subsequent encounter: Secondary | ICD-10-CM | POA: Insufficient documentation

## 2022-04-19 DIAGNOSIS — Z7982 Long term (current) use of aspirin: Secondary | ICD-10-CM | POA: Diagnosis not present

## 2022-04-19 DIAGNOSIS — Z7984 Long term (current) use of oral hypoglycemic drugs: Secondary | ICD-10-CM | POA: Diagnosis not present

## 2022-04-19 DIAGNOSIS — I509 Heart failure, unspecified: Secondary | ICD-10-CM | POA: Insufficient documentation

## 2022-04-19 DIAGNOSIS — S92351A Displaced fracture of fifth metatarsal bone, right foot, initial encounter for closed fracture: Secondary | ICD-10-CM | POA: Insufficient documentation

## 2022-04-19 DIAGNOSIS — R262 Difficulty in walking, not elsewhere classified: Secondary | ICD-10-CM | POA: Insufficient documentation

## 2022-04-19 DIAGNOSIS — D171 Benign lipomatous neoplasm of skin and subcutaneous tissue of trunk: Secondary | ICD-10-CM | POA: Insufficient documentation

## 2022-04-19 DIAGNOSIS — S0990XA Unspecified injury of head, initial encounter: Secondary | ICD-10-CM | POA: Diagnosis present

## 2022-04-19 DIAGNOSIS — R062 Wheezing: Secondary | ICD-10-CM | POA: Diagnosis not present

## 2022-04-19 DIAGNOSIS — S8991XA Unspecified injury of right lower leg, initial encounter: Secondary | ICD-10-CM | POA: Diagnosis present

## 2022-04-19 DIAGNOSIS — E119 Type 2 diabetes mellitus without complications: Secondary | ICD-10-CM | POA: Insufficient documentation

## 2022-04-19 DIAGNOSIS — Z79899 Other long term (current) drug therapy: Secondary | ICD-10-CM | POA: Diagnosis not present

## 2022-04-19 LAB — URINALYSIS, ROUTINE W REFLEX MICROSCOPIC
Bilirubin Urine: NEGATIVE
Glucose, UA: NEGATIVE mg/dL
Hgb urine dipstick: NEGATIVE
Ketones, ur: NEGATIVE mg/dL
Leukocytes,Ua: NEGATIVE
Nitrite: NEGATIVE
Protein, ur: NEGATIVE mg/dL
Specific Gravity, Urine: 1.01 (ref 1.005–1.030)
pH: 5 (ref 5.0–8.0)

## 2022-04-19 MED ORDER — ACETAMINOPHEN 500 MG PO TABS: 1000.0000 mg | ORAL_TABLET | Freq: Once | ORAL | Status: DC

## 2022-04-19 MED ORDER — ONDANSETRON 4 MG PO TBDP: 4.0000 mg | ORAL_TABLET | Freq: Once | ORAL | Status: DC

## 2022-04-19 NOTE — ED Notes (Signed)
Pt assisted up BS potty and new depends placed , D/C instructions to her daughter who is taking her back to the Nursing home

## 2022-04-19 NOTE — ED Notes (Signed)
Patient transported to CT 

## 2022-04-19 NOTE — ED Triage Notes (Signed)
Report from staff at [facility], mechanical fall yesterday, family concerned today that she wasn't getting out of bed much.  Report right and left upper leg pain.  No visible bruising or deformity to area.  Able to transfer to ems stretcher. Staff reports unwitnessed.  Pt reports hitting head, no blood thinners. Hx copd, asthma, DM pacemaker.  Pt arrives with DNR paperwork.

## 2022-04-19 NOTE — ED Notes (Signed)
Call to daughter to notify of transport to ED, per daughter pt seen at urgent care 2 days ago with no findings, reports that sometimes she tends to fall frequently when she has a uti, however she rarely presents with classic urinary symptoms, but often falls more frequently.

## 2022-04-19 NOTE — Discharge Instructions (Addendum)
Please call the orthopedist tomorrow and discuss follow-up.  Stay off of your right foot, only walking when necessary and be sure to use your walker.  Keep it iced and elevated while resting in bed.  Return with any worsening symptoms

## 2022-04-19 NOTE — ED Provider Notes (Signed)
[facility] Provider Note   CSN: [id number] Arrival date & time: 04/19/22  1355     History  Chief Complaint  Patient presents with   Samantha Hutchinson is a 71 y.o. female with a past medical history of diabetes, hypertension, COPD and heart failure presenting today after a fall that occurred 2 days ago.  She reports that she was standing up and trying to maneuver when she slipped and fell onto the right side of her body.  Hit her head and the right lower extremity on the ground.  Not on blood thinners and denies losing consciousness.  She has been having increasing difficulty ambulating secondary to right hip pain but walks with a walker at baseline.  Says she called her daughter today to catch up and when she mentioned the fall, her daughter called and ambulance.  Denies history of seizures or syncope.  No history of arrhythmia.  Denies frequent falls.  Has not taken any medication or been given anything by her skilled nursing facility.   Fall Pertinent negatives include no chest pain, no headaches and no shortness of breath.      Home Medications Prior to Admission medications   Medication Sig Start Date End Date Taking? Authorizing Provider  acetaminophen (TYLENOL) 500 MG tablet Take 500 mg by mouth every 8 (eight) hours as needed.    [provider]  albuterol (PROVENTIL HFA;VENTOLIN HFA) 108 (90 Base) MCG/ACT inhaler Inhale 2 puffs into the lungs every 6 (six) hours as needed for wheezing or shortness of breath.    [provider]  ALPRAZolam Duanne Moron) 0.25 MG tablet Take 0.25 mg by mouth 2 (two) times daily.     [provider]  aspirin EC 81 MG tablet Take 81 mg by mouth daily.    [provider]  atorvastatin (LIPITOR) 20 MG tablet Take 20 mg by mouth daily.     [provider]  busPIRone (BUSPAR) 5 MG tablet Take 5 mg by mouth 2 (two) times daily. 02/12/20   [provider]  calcium  citrate-vitamin D (CITRACAL+D) 315-200 MG-UNIT tablet Take 1 tablet by mouth daily.    [provider]  carvedilol (COREG) 6.25 MG tablet Take 6.25 mg by mouth 2 (two) times daily with a meal.    [provider]  cyclobenzaprine (FLEXERIL) 5 MG tablet Take 2 tablets (10 mg total) by mouth 2 (two) times daily as needed for muscle spasms. 06/28/21   Tegeler, Gwenyth Allegra, MD  donepezil (ARICEPT) 10 MG tablet Take 10 mg by mouth at bedtime. For Vascular Dementia    [provider]  FERREX 150 150 MG capsule Take 150 mg by mouth daily. 04/14/20   [provider]  fluticasone furoate-vilanterol (BREO ELLIPTA) 200-25 MCG/INH AEPB Inhale 1 puff into the lungs daily.    [provider]  furosemide (LASIX) 20 MG tablet Take 20 mg by mouth daily as needed. For weight gain > 3lbs    [provider]  JANUVIA 50 MG tablet Take 50 mg by mouth daily. 04/17/20   [provider]  lidocaine (LIDODERM) 5 % Place 1 patch onto the skin daily. Remove & Discard patch within 12 hours or as directed by MD 06/28/21   Tegeler, Gwenyth Allegra, MD  losartan (COZAAR) 50 MG tablet Take 50 mg by mouth daily.     [provider]  magnesium oxide (MAG-OX) 400 MG tablet Take 1 tablet by mouth 3 (three)  times daily. 02/12/20   [provider]  memantine (NAMENDA XR) 28 MG CP24 24 hr capsule Take 28 mg by mouth daily.    [provider]  metFORMIN (GLUCOPHAGE) 500 MG tablet Take 500 mg by mouth 2 (two) times daily with a meal.     [provider]  pantoprazole (PROTONIX) 40 MG tablet Take 40 mg by mouth daily.     [provider]  potassium chloride SA (K-DUR,KLOR-CON) 20 MEQ tablet Take 20 mEq by mouth daily.    [provider]  sertraline (ZOLOFT) 100 MG tablet Take 200 mg by mouth daily.     [provider]      Allergies    Azithromycin, Codeine, Demerol [meperidine hcl], Erythromycin, Penicillins, Sulfa  antibiotics, and Sulfamethizole    Review of Systems   Review of Systems  Constitutional:  Positive for fatigue.  Respiratory:  Negative for shortness of breath.   Cardiovascular:  Negative for chest pain and palpitations.  Musculoskeletal:  Positive for arthralgias and gait problem.  Neurological:  Positive for weakness. Negative for dizziness, seizures, syncope and headaches.   Physical Exam Updated Vital Signs BP (!) 145/64   Pulse 74   Temp 98.2 F (36.8 C)   Resp 20   Ht '5\' 7"'$  (1.702 m)   Wt 56.2 kg   SpO2 100%   BMI 19.42 kg/m  Physical Exam Vitals and nursing note reviewed.  Constitutional:      General: She is not in acute distress.    Appearance: Normal appearance. She is not ill-appearing.  HENT:     Head: Normocephalic and atraumatic.  Eyes:     General: No scleral icterus.    Conjunctiva/sclera: Conjunctivae normal.  Cardiovascular:     Rate and Rhythm: Normal rate and regular rhythm.  Pulmonary:     Effort: Pulmonary effort is normal. No respiratory distress.     Breath sounds: Wheezing and rales present.  Chest:     Chest wall: No tenderness.  Abdominal:     General: Abdomen is flat.     Palpations: Abdomen is soft.     Tenderness: There is no abdominal tenderness.     Comments: No bruising  Musculoskeletal:        General: Normal range of motion.     Comments: Full range of motion of bilateral hips.  Old bruising on patient's right buttocks and swelling over the dorsal right foot.  No obvious deformities or step-offs.  Large lipoma on patient's left upper back  Skin:    Findings: No rash.  Neurological:     Mental Status: She is alert.     Cranial Nerves: No cranial nerve deficit.     Motor: No weakness.  Psychiatric:        Mood and Affect: Mood normal.    ED Results / Procedures / Treatments   Labs (all labs ordered are listed, but only abnormal results are displayed) Labs Reviewed  URINALYSIS, ROUTINE W REFLEX MICROSCOPIC    EKG EKG  Interpretation  Date/Time:  Monday Apr 19 2022 14:38:32 EDT Ventricular Rate:  74 PR Interval:  142 QRS Duration: 165 QT Interval:  456 QTC Calculation: 506 R Axis:   70 Text Interpretation: Sinus rhythm Multiple ventricular premature complexes IVCD, consider atypical LBBB No old tracing to compare Confirmed by Deno Etienne [id number] on 04/19/2022 2:44:02 PM  Radiology CT Head Wo Contrast  Result Date: 04/19/2022 CLINICAL DATA:  Head trauma, moderate-severe; Neck trauma, dangerous injury mechanism. Unwitnessed  falls yesterday. EXAM: CT HEAD WITHOUT CONTRAST CT CERVICAL SPINE WITHOUT CONTRAST TECHNIQUE: Multidetector CT imaging of the head and cervical spine was performed following the standard protocol without intravenous contrast. Multiplanar CT image reconstructions of the cervical spine were also generated. RADIATION DOSE REDUCTION: This exam was performed according to the departmental dose-optimization program which includes automated exposure control, adjustment of the mA and/or kV according to patient size and/or use of iterative reconstruction technique. COMPARISON:  Head CT 11/12/2021.  Cervical spine CT 11/11/2021. FINDINGS: CT HEAD FINDINGS Brain: There is no evidence of an acute infarct, intracranial hemorrhage, mass, midline shift, or extra-axial fluid collection. Mild cerebral atrophy is within normal limits for age. Hypodensities in the cerebral white matter bilaterally are unchanged and nonspecific but compatible with mild chronic small vessel ischemic disease. 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 A focus of coarse calcification in the quadrigeminal cistern on the right is unchanged. Vascular: Calcified atherosclerosis at the skull base. No hyperdense vessel. Skull: No acute fracture or suspicious osseous lesion. Sinuses/Orbits: Mild mucosal thickening in the paranasal sinuses. Clear mastoid air cells. Bilateral cataract  extraction. Other: None. CT CERVICAL SPINE FINDINGS Alignment: Straightening of the normal cervical lordosis. No evidence of acute traumatic subluxation. Skull base and vertebrae: No acute fracture or suspicious osseous lesion. Old right first through third rib fractures. Soft tissues and spinal canal: No prevertebral fluid or swelling. No visible canal hematoma. Disc levels: Disc degeneration greatest at C5-6 where there is moderate disc space narrowing, degenerative endplate sclerosis, and spurring. No evidence of high-grade stenosis. Upper chest: No apical lung consolidation or mass. Other: Prominent calcific atherosclerosis at the carotid bifurcations. IMPRESSION: 1. No evidence of acute intracranial abnormality. 2. Mild chronic small vessel ischemic disease. 3. No evidence of acute fracture or traumatic subluxation in the cervical spine. Electronically Signed   By: Logan Bores M.D.   On: 04/19/2022 14:44   CT Cervical Spine Wo Contrast  Result Date: 04/19/2022 CLINICAL DATA:  Head trauma, moderate-severe; Neck trauma, dangerous injury mechanism. Unwitnessed falls yesterday. EXAM: CT HEAD WITHOUT CONTRAST CT CERVICAL SPINE WITHOUT CONTRAST TECHNIQUE: Multidetector CT imaging of the head and cervical spine was performed following the standard protocol without intravenous contrast. Multiplanar CT image reconstructions of the cervical spine were also generated. RADIATION DOSE REDUCTION: This exam was performed according to the departmental dose-optimization program which includes automated exposure control, adjustment of the mA and/or kV according to patient size and/or use of iterative reconstruction technique. COMPARISON:  Head CT 11/12/2021.  Cervical spine CT 11/11/2021. FINDINGS: CT HEAD FINDINGS Brain: There is no evidence of an acute infarct, intracranial hemorrhage, mass, midline shift, or extra-axial fluid collection. Mild cerebral atrophy is within normal limits for age. Hypodensities in the cerebral  white matter bilaterally are unchanged and nonspecific but compatible with mild chronic small vessel ischemic disease. 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 A focus of coarse calcification in the quadrigeminal cistern on the right is unchanged. Vascular: Calcified atherosclerosis at the skull base. No hyperdense vessel. Skull: No acute fracture or suspicious osseous lesion. Sinuses/Orbits: Mild mucosal thickening in the paranasal sinuses. Clear mastoid air cells. Bilateral cataract extraction. Other: None. CT CERVICAL SPINE FINDINGS Alignment: Straightening of the normal cervical lordosis. No evidence of acute traumatic subluxation. Skull base and vertebrae: No acute fracture or suspicious osseous lesion. Old right first through third rib fractures. Soft tissues and spinal canal:  No prevertebral fluid or swelling. No visible canal hematoma. Disc levels: Disc degeneration greatest at C5-6 where there is moderate disc space narrowing, degenerative endplate sclerosis, and spurring. No evidence of high-grade stenosis. Upper chest: No apical lung consolidation or mass. Other: Prominent calcific atherosclerosis at the carotid bifurcations. IMPRESSION: 1. No evidence of acute intracranial abnormality. 2. Mild chronic small vessel ischemic disease. 3. No evidence of acute fracture or traumatic subluxation in the cervical spine. Electronically Signed   By: Logan Bores M.D.   On: 04/19/2022 14:44   CT Hip Right Wo Contrast  Result Date: 04/19/2022 CLINICAL DATA:  Right hip pain after fall yesterday. EXAM: CT OF THE RIGHT HIP WITHOUT CONTRAST TECHNIQUE: Multidetector CT imaging of the right hip was performed according to the standard protocol. Multiplanar CT image reconstructions were also generated. RADIATION DOSE REDUCTION: This exam was performed according to the departmental dose-optimization program which includes automated exposure control, adjustment  of the mA and/or kV according to patient size and/or use of iterative reconstruction technique. COMPARISON:  Right hip x-rays from same day. FINDINGS: Bones/Joint/Cartilage Prior right hip hemiarthroplasty. Prior left femur ORIF. No evidence of hardware failure or loosening. No fracture or dislocation. Osteopenia. No joint effusion. Ligaments Ligaments are suboptimally evaluated by CT. Muscles and Tendons Grossly intact. Soft tissue No fluid collection or hematoma.  No soft tissue mass. IMPRESSION: 1. No acute osseous abnormality. 2. Prior right hip hemiarthroplasty and left femur ORIF. No evidence of hardware complication. Electronically Signed   By: Titus Dubin M.D.   On: 04/19/2022 16:44   DG Chest Portable 1 View  Result Date: 04/19/2022 CLINICAL DATA:  Wheezing. EXAM: PORTABLE CHEST 1 VIEW COMPARISON:  Chest radiograph dated Apr 16, 2022 FINDINGS: The heart size and mediastinal contours are within normal limits. Pacemaker leads in the right atrium, right ventricle and coronary sinus, unchanged. Atherosclerotic calcification of the aortic arch. Both lungs are clear. Right acromioclavicular osteoarthritis. Thoracic spondylosis. IMPRESSION: No active disease. Electronically Signed   By: Keane Police D.O.   On: 04/19/2022 16:19   DG Foot Complete Right  Result Date: 04/19/2022 CLINICAL DATA:  Pain, fall EXAM: RIGHT FOOT COMPLETE - 3+ VIEW COMPARISON:  None Available. FINDINGS: There is a mildly displaced, obliquely oriented fracture of the distal fifth metatarsal shaft. Osteopenia. Adjacent soft tissue swelling. IMPRESSION: Mildly displaced, obliquely oriented fracture of the distal fifth metatarsal shaft. Electronically Signed   By: Maurine Simmering M.D.   On: 04/19/2022 15:58   DG Hip Unilat W or Wo Pelvis 2-3 Views Right  Result Date: 04/19/2022 CLINICAL DATA:  Pain, fall EXAM: DG HIP (WITH OR WITHOUT PELVIS) 2-3V RIGHT COMPARISON:  Radiograph 08/26/2021 FINDINGS: There is a right hip arthroplasty in  normal alignment without evidence of loosening or periprosthetic fracture. There is a cortical step-off along the inner margin of the right pubic body. Prior left proximal femur fixation, partially visualized. IMPRESSION: Possible nondisplaced right pubic body fracture, recommend CT. No evidence of right hip arthroplasty complication. Electronically Signed   By: Maurine Simmering M.D.   On: 04/19/2022 16:04    Procedures Procedures   Medications Ordered in ED Medications - No data to display  ED Course/ Medical Decision Making/ A&P                           Medical Decision Making Amount and/or Complexity of Data Reviewed Labs: ordered. Radiology: ordered.   This patient presents to the ED for concern of  right-sided pain after a fall.  Differential for the fall includes seizure/syncope/brain mass/hydrocephalus/dementia/electrolyte abnormality/orthostatic hypotension.   This is not an exhaustive differential.    Past Medical History / Co-morbidities / Social History: Diabetes, hypertension, heart failure and previous right hip replacement     Physical Exam: Physical exam performed. The pertinent findings include: Swelling and tenderness over the dorsal right foot.  Also with tenderness to the inferior right, over where she had a previous arthroplasty.  Lab Tests: I ordered, and personally interpreted labs.  The pertinent results include: Negative UA   Imaging Studies:  I independently visualized and interpreted imaging which showed a right fifth metatarsal fracture. I agree with the radiologist interpretation.  X-ray of hip with potential pubic body fracture.  CT ordered.  CT head, cervical spine and chest x-ray negative  I independently visualized and interpreted patient's CT hip.  This showed her arthroplasty but no other abnormalities.   Cardiac Monitoring:  The patient was maintained on a cardiac monitor.  My attending physician Tyrone Nine viewed and interpreted the cardiac monitored  which showed an underlying rhythm of: Sinus   Medications: Patient declined pain medication.  Disposition: After consideration of the diagnostic results and the patients response to treatment, I feel that she is stable for discharge home.  EKG stable from previous, CT head and neck negative.  Patient is adamant this is a mechanical fall.  She will be placed in a cam boot and instructed to only walk with her walker when necessary.  Otherwise will rest, elevate and ice the joint.  Given follow-up with orthopedics.  She will return with any worsening symptoms.  She was made aware that she does not have a urinary tract infection as well.   I discussed this case with my attending physician Dr. Tyrone Nine who cosigned this note including patient's presenting symptoms, physical exam, and planned diagnostics and interventions. Attending physician stated agreement with plan or made changes to plan which were implemented.     Final Clinical Impression(s) / ED Diagnoses Final diagnoses:  Closed displaced fracture of fifth metatarsal bone of right foot, initial encounter    Rx / DC Orders  Results and diagnoses were explained to the patient. Return precautions discussed in full. Patient had no additional questions and expressed complete understanding.   This chart was dictated using voice recognition software.  Despite best efforts to proofread,  errors can occur which can change the documentation meaning.    Rhae Hammock, PA-C 04/19/22 [place], [place], DO 04/20/22 [phone]

## 2022-04-21 ENCOUNTER — Ambulatory Visit (INDEPENDENT_AMBULATORY_CARE_PROVIDER_SITE_OTHER): Payer: Medicare PPO | Admitting: Family Medicine

## 2022-04-21 ENCOUNTER — Telehealth: Payer: Self-pay | Admitting: Family Medicine

## 2022-04-21 ENCOUNTER — Encounter: Payer: Self-pay | Admitting: Family Medicine

## 2022-04-21 VITALS — BP 110/68 | Ht 67.0 in | Wt 124.0 lb

## 2022-04-21 DIAGNOSIS — S92351A Displaced fracture of fifth metatarsal bone, right foot, initial encounter for closed fracture: Secondary | ICD-10-CM

## 2022-04-21 DIAGNOSIS — S92351D Displaced fracture of fifth metatarsal bone, right foot, subsequent encounter for fracture with routine healing: Secondary | ICD-10-CM | POA: Insufficient documentation

## 2022-04-21 NOTE — Assessment & Plan Note (Signed)
Acutely occurring.  Mildly displaced on x-ray. -Counseled on home exercise therapy and supportive care. -Counseled on CAM Walker. -She will send her most recent bone density. -Follow-up in 4 weeks and can reimage at that time.

## 2022-04-21 NOTE — Telephone Encounter (Signed)
Received a call frm [place] @ [facility] (phone: [phone]) asking for instruction on pt Aircast given pt today : -They need to know if it can be removed for Sleeping/bathing? -Ask if it needs to stay on patient All Day?  -Ask how to take it off & put it back on ?   -- Forwarding  message to [facility] to review w/ provider. --Please contact them with Instructions  --glh

## 2022-04-21 NOTE — Patient Instructions (Signed)
Nice to meet you Please continue the CAM walker  Please try to send me the last bone density  Please send me a message in [place] with any questions or updates.  Please see me back in 4 weeks.   --Dr. Raeford Razor

## 2022-04-21 NOTE — Progress Notes (Signed)
  Samantha Hutchinson - 71 y.o. female MRN [id number]  Date of birth: May 10, 1951  SUBJECTIVE:  Including CC & ROS.  No chief complaint on file.   Samantha Hutchinson is a 71 y.o. female that is presenting with right foot pain after a fall.  She had a fall in the bathroom and fell onto her right side.  Imaging was showing no acute changes except for her right foot.  This was demonstrating a fracture.  She is having some swelling but no significant pain.  She was placed in a cam walker.  Independent review of the right foot x-ray from 5/22 shows a mildly displaced obliquely oriented fracture of the distal fifth metatarsal shaft. Independent review of the CT hip from 5/22 shows stable postsurgical hardware.  Review of Systems See HPI   HISTORY: Past Medical, Surgical, Social, and Family History Reviewed & Updated per EMR.   Pertinent Historical Findings include:  Past Medical History:  Diagnosis Date   Asthma    Colon polyp    COPD (chronic obstructive pulmonary disease) ([place])    Diabetes ([place])    Fall    Hip fracture ([place])    HLD (hyperlipidemia)    HTN (hypertension)    Muscle weakness    Wrist fracture     Past Surgical History:  Procedure Laterality Date   CHOLECYSTECTOMY     DILATION AND CURETTAGE OF UTERUS     ICD IMPLANT     JOINT REPLACEMENT     TOTAL ABDOMINAL HYSTERECTOMY  1980   TOTAL HIP ARTHROPLASTY Left      PHYSICAL EXAM:  VS: BP 110/68 (BP Location: Left Arm, Patient Position: Sitting)   Ht '5\' 7"'$  (1.702 m)   Wt 124 lb (56.2 kg)   BMI 19.42 kg/m  Physical Exam Gen: NAD, alert, cooperative with exam, well-appearing MSK:  Neurovascularly intact       ASSESSMENT & PLAN:   Displaced fracture of fifth metatarsal bone, right foot, initial encounter for closed fracture Acutely occurring.  Mildly displaced on x-ray. -Counseled on home exercise therapy and supportive care. -Counseled on CAM Walker. -She will send her most recent bone density. -Follow-up in 4  weeks and can reimage at that time.

## 2022-04-22 NOTE — Telephone Encounter (Signed)
Completed written prescription to fax.   Rosemarie Ax, MD [facility] 04/22/2022, 8:15 AM

## 2022-04-22 NOTE — Telephone Encounter (Signed)
Written order faxed to [facility].

## 2022-05-20 ENCOUNTER — Ambulatory Visit (HOSPITAL_BASED_OUTPATIENT_CLINIC_OR_DEPARTMENT_OTHER)
Admission: RE | Admit: 2022-05-20 | Discharge: 2022-05-20 | Disposition: A | Discharge status: Home / Self Care | Payer: Medicare PPO | Source: Ambulatory Visit | Attending: Family Medicine | Admitting: Family Medicine

## 2022-05-20 ENCOUNTER — Encounter: Payer: Self-pay | Admitting: Family Medicine

## 2022-05-20 ENCOUNTER — Ambulatory Visit (INDEPENDENT_AMBULATORY_CARE_PROVIDER_SITE_OTHER): Payer: Medicare PPO | Admitting: Family Medicine

## 2022-05-20 VITALS — BP 118/72 | Ht 67.0 in | Wt 124.0 lb

## 2022-05-20 DIAGNOSIS — IMO0001 Reserved for inherently not codable concepts without codable children: Secondary | ICD-10-CM

## 2022-05-20 DIAGNOSIS — S32030G Wedge compression fracture of third lumbar vertebra, subsequent encounter for fracture with delayed healing: Secondary | ICD-10-CM | POA: Insufficient documentation

## 2022-05-20 DIAGNOSIS — S32010A Wedge compression fracture of first lumbar vertebra, initial encounter for closed fracture: Secondary | ICD-10-CM

## 2022-05-20 DIAGNOSIS — S32030B Wedge compression fracture of third lumbar vertebra, initial encounter for open fracture: Secondary | ICD-10-CM | POA: Diagnosis not present

## 2022-05-20 DIAGNOSIS — M8000XA Age-related osteoporosis with current pathological fracture, unspecified site, initial encounter for fracture: Secondary | ICD-10-CM

## 2022-05-20 DIAGNOSIS — S92351D Displaced fracture of fifth metatarsal bone, right foot, subsequent encounter for fracture with routine healing: Secondary | ICD-10-CM

## 2022-05-20 DIAGNOSIS — S0101XA Laceration without foreign body of scalp, initial encounter: Secondary | ICD-10-CM

## 2022-05-20 NOTE — Assessment & Plan Note (Signed)
Age-indeterminate.  Acutely occurring with recent episode of back pain. -Counseled on home exercise therapy and supportive care. -Referral to physical therapy. -Could consider bracing.

## 2022-05-20 NOTE — Assessment & Plan Note (Signed)
Found on her recent visit to the emergency department. -Counseled on home exercise therapy and supportive care. -Referral to physical therapy. -Could consider bracing.

## 2022-05-20 NOTE — Assessment & Plan Note (Signed)
Most recent bone density is showing a T score of -2.8.  She also has a history of distal radius fracture multiple compression fractures.  -Counseled on supportive care. -Would consider pursuing Evenity or Prolia going forward. -Would get updated bone density.

## 2022-05-20 NOTE — Assessment & Plan Note (Signed)
Removed 4 staples that were placed in her emergency department visit on 6/2 Today. The wound was clean dry and intact.

## 2022-05-20 NOTE — Progress Notes (Signed)
  Samantha Hutchinson - 71 y.o. female MRN [id number]  Date of birth: October 05, 1951  SUBJECTIVE:  Including CC & ROS.  No chief complaint on file.   Samantha Hutchinson is a 71 y.o. female that is presenting after a fall on 6/2.  She is found to have a scalp laceration at that time that was stapled.  She is also following up for her right foot fracture.  She was found to have compression fractures at multiple places in the emergency department at that visit.   Review of the right hip x-ray from 6/2 shows no fracture. Review of the CT cervical spine from 6/2 shows no acute cervical spine findings but an upper right rib healed fracture with a chronic nonunited left medial clavicle fracture. Review of the lumbar spine x-ray from 6/2 shows old L1 and L3 compression fractures. Review of the thoracic spine x-ray from 6/2 shows multiple old compression fractures Review of the bone density scan from 2021 shows a T score of -2.8.  Review of Systems See HPI   HISTORY: Past Medical, Surgical, Social, and Family History Reviewed & Updated per EMR.   Pertinent Historical Findings include:  Past Medical History:  Diagnosis Date   Asthma    Colon polyp    COPD (chronic obstructive pulmonary disease) ([place])    Diabetes ([place])    Fall    Hip fracture ([place])    HLD (hyperlipidemia)    HTN (hypertension)    Muscle weakness    Wrist fracture     Past Surgical History:  Procedure Laterality Date   CHOLECYSTECTOMY     DILATION AND CURETTAGE OF UTERUS     ICD IMPLANT     JOINT REPLACEMENT     TOTAL ABDOMINAL HYSTERECTOMY  1980   TOTAL HIP ARTHROPLASTY Left      PHYSICAL EXAM:  VS: BP 118/72 (BP Location: Left Arm, Patient Position: Sitting)   Ht '5\' 7"'$  (1.702 m)   Wt 124 lb (56.2 kg)   BMI 19.42 kg/m  Physical Exam Gen: NAD, alert, cooperative with exam, well-appearing MSK:  Neurovascularly intact       ASSESSMENT & PLAN:   Age-related osteoporosis with current pathological fracture Most recent  bone density is showing a T score of -2.8.  She also has a history of distal radius fracture multiple compression fractures.  -Counseled on supportive care. -Would consider pursuing Evenity or Prolia going forward. -Would get updated bone density.  Compression fracture of L1 lumbar vertebra (HCC) Age-indeterminate.  Acutely occurring with recent episode of back pain. -Counseled on home exercise therapy and supportive care. -Referral to physical therapy. -Could consider bracing.  Compression fracture of L3 lumbar vertebra, open, initial encounter ([place]) Found on her recent visit to the emergency department. -Counseled on home exercise therapy and supportive care. -Referral to physical therapy. -Could consider bracing.  Displaced fracture of fifth metatarsal bone, right foot, subsequent encounter for fracture with routine healing Acutely occurring.  Initial injury occurred on 5/20.  Having some mild tenderness and swelling on exam today. -Counseled on home exercise therapy and supportive care. -X-ray. -Continue cam walker. -Follow-up in 4 weeks.  Scalp laceration Removed 4 staples that were placed in her emergency department visit on 6/2 Today. The wound was clean dry and intact.

## 2022-05-20 NOTE — Patient Instructions (Signed)
Good to see you  I will call with the xray results  We'll start some physical therapy  Please send me a message in MyChart with any questions or updates.  Please see me back in 4 weeks.   --Dr. Raeford Razor

## 2022-05-20 NOTE — Assessment & Plan Note (Signed)
Acutely occurring.  Initial injury occurred on 5/20.  Having some mild tenderness and swelling on exam today. -Counseled on home exercise therapy and supportive care. -X-ray. -Continue cam walker. -Follow-up in 4 weeks.

## 2022-05-21 ENCOUNTER — Telehealth: Payer: Self-pay | Admitting: Family Medicine

## 2022-05-21 NOTE — Telephone Encounter (Signed)
Spoke with daughter about her mother's osteoporosis and fracture.  We will proceed on Prolia.  Myra Rude, MD [facility] 05/21/2022, 8:52 AM

## 2022-05-24 NOTE — Telephone Encounter (Signed)
Prolia VOB initiated via [email or web address] ? ?New start ? ?

## 2022-05-29 NOTE — Telephone Encounter (Signed)
Prior auth required for Prolia  PA PROCESS DETAILS: PA is required. PA can be initiated by calling [phone] or online at [email or web address]

## 2022-05-29 NOTE — Telephone Encounter (Signed)
Prior Authorization initiated for Prolia via [email or web address] KEY: [id number]

## 2022-06-02 NOTE — Telephone Encounter (Signed)
Rec'd fax from [facility].  Prolia PA is approved 05/29/22 to 11/28/22. EOC#: [id number]

## 2022-06-05 NOTE — Telephone Encounter (Signed)
PA# [id number], Key: [id number] Valid: 05/29/22-11/28/22

## 2022-06-05 NOTE — Telephone Encounter (Signed)
Pt ready for scheduling on or after 05/29/22  Out-of-pocket cost due at time of visit: $40  Primary: [employer] Prolia co-insurance: $40 Admin fee co-insurance: 0%  Secondary: n/a Prolia co-insurance:  Admin fee co-insurance:   Deductible: does not apply  Prior Auth: APPROVED PA# [id number], Key: [id number] Valid: 05/29/22-11/28/22    ** This summary of benefits is an estimation of the patient's out-of-pocket cost. Exact cost may very based on individual plan coverage.

## 2022-06-07 NOTE — Telephone Encounter (Signed)
Patient's daughter informed of below. She will get her 1st Prolia at her 06/17/22 OV with Dr. Raeford Razor.

## 2022-06-17 ENCOUNTER — Telehealth: Payer: Self-pay | Admitting: Family Medicine

## 2022-06-17 ENCOUNTER — Ambulatory Visit (INDEPENDENT_AMBULATORY_CARE_PROVIDER_SITE_OTHER): Payer: Medicare PPO | Admitting: *Deleted

## 2022-06-17 DIAGNOSIS — M8000XA Age-related osteoporosis with current pathological fracture, unspecified site, initial encounter for fracture: Secondary | ICD-10-CM | POA: Diagnosis not present

## 2022-06-17 MED ORDER — DENOSUMAB 60 MG/ML ~~LOC~~ SOSY
60.0000 mg | PREFILLED_SYRINGE | Freq: Once | SUBCUTANEOUS | Status: AC
  Administered 2022-06-17: 60 mg via SUBCUTANEOUS

## 2022-06-17 NOTE — Telephone Encounter (Signed)
Patient's daughter Ms.Hauge cld re: missing PT referral.(Ask that on be sent to [facility] so they can process order thru they PT provider they  use)  [facility] ph# [phone]  Fx# [phone].  _Pt's daughter also wants Dr. Raeford Razor to go ahead and order pt's Bone Density testing.  --Forwarding request to med asst to enter order & fax to pt's assisted living provider.  -glh

## 2022-06-17 NOTE — [place] Notes (Signed)
Patient is here for her 1st Prolia injection. Patient received [place] injection in her left arm. She waited in the office for 15 minutes after injection. She tolerated the injection well. She will return for her next Prolia in 6 months.

## 2022-06-22 NOTE — Addendum Note (Signed)
Addended by: Cresenciano Lick on: 06/22/2022 09:23 AM   Modules accepted: Orders

## 2022-06-22 NOTE — Telephone Encounter (Signed)
Referral faxed to number below. DEXA scan ordered at [facility]. Left detailed message informing pt's daughter, Cyril Mourning.

## 2022-06-29 ENCOUNTER — Ambulatory Visit: Payer: Medicare PPO | Admitting: Family Medicine

## 2022-07-06 ENCOUNTER — Ambulatory Visit (HOSPITAL_BASED_OUTPATIENT_CLINIC_OR_DEPARTMENT_OTHER)
Admission: RE | Admit: 2022-07-06 | Discharge: 2022-07-06 | Disposition: A | Discharge status: Home / Self Care | Payer: Medicare PPO | Source: Ambulatory Visit | Attending: Family Medicine | Admitting: Family Medicine

## 2022-07-06 ENCOUNTER — Encounter: Payer: Self-pay | Admitting: Family Medicine

## 2022-07-06 ENCOUNTER — Ambulatory Visit (INDEPENDENT_AMBULATORY_CARE_PROVIDER_SITE_OTHER): Payer: Medicare PPO | Admitting: Family Medicine

## 2022-07-06 VITALS — BP 106/60 | Ht 67.0 in | Wt 124.0 lb

## 2022-07-06 DIAGNOSIS — M8000XG Age-related osteoporosis with current pathological fracture, unspecified site, subsequent encounter for fracture with delayed healing: Secondary | ICD-10-CM

## 2022-07-06 DIAGNOSIS — S92351D Displaced fracture of fifth metatarsal bone, right foot, subsequent encounter for fracture with routine healing: Secondary | ICD-10-CM | POA: Diagnosis present

## 2022-07-06 DIAGNOSIS — S32030G Wedge compression fracture of third lumbar vertebra, subsequent encounter for fracture with delayed healing: Secondary | ICD-10-CM | POA: Diagnosis not present

## 2022-07-06 NOTE — Assessment & Plan Note (Signed)
Initial injury on 5/20.  No pain today. -Counseled on home exercise therapy and supportive care. -X-ray. -Continue physical therapy.

## 2022-07-06 NOTE — Assessment & Plan Note (Signed)
She was recently started on Prolia.  Continues to have back pain.  She does sit in the wheelchair.  She is able to ambulate at times. -Counseled on home exercise therapy and supportive care. -Continue physical therapy. -Pursue back brace.

## 2022-07-06 NOTE — [place] Notes (Signed)
  Samantha Hutchinson - 71 y.o. female MRN [id number]  Date of birth: Oct 14, 1951  SUBJECTIVE:  Including CC & ROS.  No chief complaint on file.   Samantha Hutchinson is a 71 y.o. female that is following up for her right foot fracture and compression fractures.  She denies any pain in her foot.  She still continues to have back pain.  She is in physical therapy.   Review of Systems See HPI   HISTORY: Past Medical, Surgical, Social, and Family History Reviewed & Updated per EMR.   Pertinent Historical Findings include:  Past Medical History:  Diagnosis Date   Asthma    Colon polyp    COPD (chronic obstructive pulmonary disease) ([place])    Diabetes ([place])    Fall    Hip fracture ([place])    HLD (hyperlipidemia)    HTN (hypertension)    Muscle weakness    Wrist fracture     Past Surgical History:  Procedure Laterality Date   CHOLECYSTECTOMY     DILATION AND CURETTAGE OF UTERUS     ICD IMPLANT     JOINT REPLACEMENT     TOTAL ABDOMINAL HYSTERECTOMY  1980   TOTAL HIP ARTHROPLASTY Left      PHYSICAL EXAM:  VS: BP 106/60 (BP Location: Left Arm, Patient Position: Sitting)   Ht '5\' 7"'$  (1.702 m)   Wt 124 lb (56.2 kg)   BMI 19.42 kg/m  Physical Exam Gen: NAD, alert, cooperative with exam, well-appearing MSK:  Neurovascularly intact       ASSESSMENT & PLAN:   Displaced fracture of fifth metatarsal bone, right foot, subsequent encounter for fracture with routine healing Initial injury on 5/20.  No pain today. -Counseled on home exercise therapy and supportive care. -X-ray. -Continue physical therapy.  Compression fracture of L3 lumbar vertebra, with delayed healing, subsequent encounter She was recently started on Prolia.  Continues to have back pain.  She does sit in the wheelchair.  She is able to ambulate at times. -Counseled on home exercise therapy and supportive care. -Continue physical therapy. -Pursue back brace.  Age-related osteoporosis with current pathological  fracture Obtain bone density that was ordered.  And continue with Prolia.

## 2022-07-06 NOTE — Patient Instructions (Signed)
Good to see you  I will call with the xray results.  Please try to obtain the bone density  I will have the [employer] rep contact you about the brace  Please send me a message in MyChart with any questions or updates.  Please see me back in 6-8 weeks.   --Dr. Raeford Razor

## 2022-07-06 NOTE — Telephone Encounter (Signed)
Last Prolia inj 06/17/22 Next Prolia inj due 12/19/22

## 2022-07-06 NOTE — Assessment & Plan Note (Signed)
Obtain bone density that was ordered.  And continue with Prolia.

## 2022-07-08 ENCOUNTER — Telehealth: Payer: Self-pay | Admitting: Family Medicine

## 2022-07-08 NOTE — Telephone Encounter (Signed)
Informed of results  Rosemarie Ax, MD [facility] 07/08/2022, 1:55 PM

## 2022-07-12 ENCOUNTER — Encounter: Payer: Self-pay | Admitting: Podiatry

## 2022-07-12 ENCOUNTER — Ambulatory Visit: Payer: Medicare PPO | Admitting: Podiatry

## 2022-07-12 DIAGNOSIS — B351 Tinea unguium: Secondary | ICD-10-CM | POA: Diagnosis not present

## 2022-07-12 DIAGNOSIS — M79676 Pain in unspecified toe(s): Secondary | ICD-10-CM

## 2022-07-12 DIAGNOSIS — E0843 Diabetes mellitus due to underlying condition with diabetic autonomic (poly)neuropathy: Secondary | ICD-10-CM | POA: Diagnosis not present

## 2022-07-12 NOTE — [place] Notes (Signed)
This patient returns to my office for at risk foot care.  This patient requires this care by a professional since this patient will be at risk due to having diabetes.  This patient is unable to cut nails herself since the patient cannot reach her nails.These nails are painful walking and wearing shoes.   This patient presents for at risk foot care today. She presents to the office with her daughter.  General Appearance  Alert, conversant and in no acute stress.  Vascular  Dorsalis pedis   pulses are  weakly palpable  Bilaterally.  Posterior tibial pulses are absent.  Capillary return is within normal limits  bilaterally. Cold feet.   Bilaterally. Absent digital hair.  Neurologic  Senn-Weinstein monofilament wire test within normal limits  bilaterally. Muscle power within normal limits bilaterally.  Nails Thick disfigured discolored nails with subungual debris  from hallux to fifth toes bilaterally. No evidence of bacterial infection or drainage bilaterally.  Orthopedic  No limitations of motion  feet .  No crepitus or effusions noted.  Adducto-varus 4,5  B/L.  Mallet toe 2,3,4 right  Skin  Thin  skin with no porokeratosis noted bilaterally.  No signs of infections or ulcers noted.     Onychomycosis  Pain in right toes  Pain in left toes  Consent was obtained for treatment procedures.   Mechanical debridement of nails 1-5  bilaterally performed with a nail nipper.  Filed with dremel without incident.     Return office visit   3 months                   Told patient to return for periodic foot care and evaluation due to potential at risk complications.   Gardiner Barefoot DPM

## 2022-07-15 ENCOUNTER — Ambulatory Visit (HOSPITAL_BASED_OUTPATIENT_CLINIC_OR_DEPARTMENT_OTHER)
Admission: RE | Admit: 2022-07-15 | Discharge: 2022-07-15 | Disposition: A | Discharge status: Home / Self Care | Payer: Medicare PPO | Source: Ambulatory Visit | Attending: Family Medicine | Admitting: Family Medicine

## 2022-07-15 DIAGNOSIS — M8000XA Age-related osteoporosis with current pathological fracture, unspecified site, initial encounter for fracture: Secondary | ICD-10-CM | POA: Insufficient documentation

## 2022-07-15 DIAGNOSIS — Z780 Asymptomatic menopausal state: Secondary | ICD-10-CM | POA: Diagnosis not present

## 2022-07-15 DIAGNOSIS — E119 Type 2 diabetes mellitus without complications: Secondary | ICD-10-CM | POA: Insufficient documentation

## 2022-07-19 ENCOUNTER — Telehealth: Payer: Self-pay | Admitting: Family Medicine

## 2022-07-19 NOTE — Telephone Encounter (Signed)
Left VM for patient. If she calls back please have her speak with a nurse/CMA and inform that her bone density is showing a T score of -3.5.  We could consider switching from Prolia to [place].   If any questions then please take the best time and phone number to call and I will try to call her back.   Rosemarie Ax, MD [facility] 07/19/2022, 4:20 PM

## 2022-09-01 ENCOUNTER — Ambulatory Visit: Payer: Medicare PPO | Admitting: Family Medicine

## 2022-10-13 ENCOUNTER — Ambulatory Visit: Payer: Medicare PPO | Admitting: Podiatry

## 2022-11-04 NOTE — Telephone Encounter (Signed)
Prolia VOB initiated via [email or web address]  Last Prolia inj 06/17/22 Next Prolia inj due 12/19/22  Needs prior auth renewal PA exp 11/28/22

## 2022-11-09 NOTE — Telephone Encounter (Signed)
Prior auth renewal required for [employer]  Prior Authorization initiated for [employer] via [email or web address] KEY: [id number] - PA Case ID: [id number]

## 2022-11-17 NOTE — Telephone Encounter (Signed)
Pt ready for scheduling on or after 11/17/22  Out-of-pocket cost due at time of visit: $60  Primary: [employer] [place] Prolia co-insurance: $40 Admin fee co-insurance: 0%  Secondary: n/a Prolia co-insurance:  Admin fee co-insurance:   Deductible: does not apply  Prior Auth:  PA# Valid:     ** This summary of benefits is an estimation of the patient's out-of-pocket cost. Exact cost may very based on individual plan coverage.

## 2022-11-17 NOTE — Telephone Encounter (Signed)
Key: [id number] - PA Case ID: [id number] Valid: 05/29/22-11/28/22

## 2022-11-26 NOTE — Telephone Encounter (Deleted)
APPROVED (Key: [id number]) PA Case ID #: [id number] Valid 05/29/22-11/28/22

## 2022-11-26 NOTE — Telephone Encounter (Signed)
Renewing prior auth for 2024. Initiated via [email or web address] (Key: [id number]) - [id number]

## 2022-11-30 NOTE — Telephone Encounter (Signed)
(  Key: [id number]) PA Case ID #: [id number] Valid: 11/26/22-11/29/23

## 2022-11-30 NOTE — Telephone Encounter (Signed)
Re-running benefits for 2024.

## 2022-12-01 NOTE — Telephone Encounter (Signed)
Received fax from [facility] for [place] [phone]) has been approved for the following date: 11/20/2022 to 11/29/2023.   Auth #: [id number]

## 2022-12-05 NOTE — Telephone Encounter (Addendum)
Pt ready for scheduling on or after 12/19/2022  Out-of-pocket cost due at time of visit: $0  Primary: [employer] PPO [place] co-insurance: 0% Admin fee co-insurance: 0%  Secondary: n/a Prolia co-insurance:  Admin fee co-insurance:   Deductible: does not apply  Prior Auth: APPROVED PA Case ID #: [id number], Key: [id number] Valid: 11/26/22-11/29/23    ** This summary of benefits is an estimation of the patient's out-of-pocket cost. Exact cost may very based on individual plan coverage.

## 2022-12-05 NOTE — Telephone Encounter (Signed)
2024 VOB

## 2022-12-06 NOTE — Telephone Encounter (Signed)
Called pt to inform of below. No answer. Unable to leave vm.

## 2022-12-09 NOTE — Telephone Encounter (Signed)
Called again no answer  

## 2022-12-10 NOTE — Telephone Encounter (Signed)
Called pt x 3- no answer. Unable to leave vm. Closing phone note.

## 2023-02-12 NOTE — Telephone Encounter (Signed)
Pt archived in [email or web address].  Please advise if patient and/or provider wish to proceed with Prolia therpay.  

## 2023-03-15 ENCOUNTER — Encounter: Payer: Self-pay | Admitting: *Deleted

## 2023-05-24 IMAGING — DX DG HIP (WITH OR WITHOUT PELVIS) 2-3V*R*
3 series · 3 of 3 positions shown · non-contrast
Comparison: Radiograph 08/26/2021

CLINICAL DATA: Pain, fall

EXAM:
DG HIP (WITH OR WITHOUT PELVIS) 2-3V RIGHT

[pelvis ap]
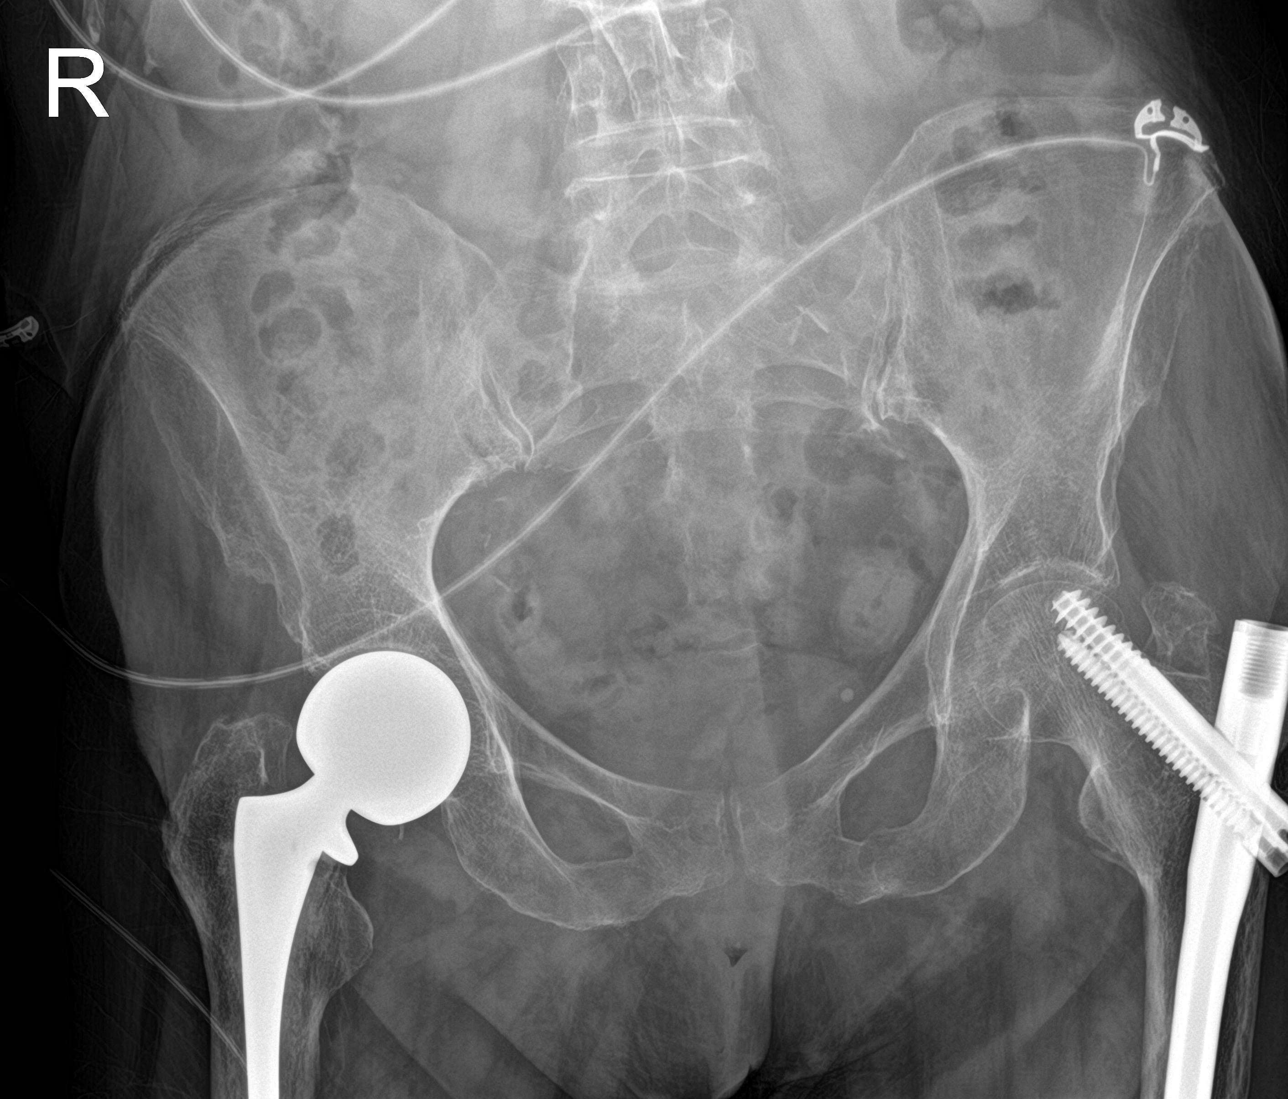

[hip ap]
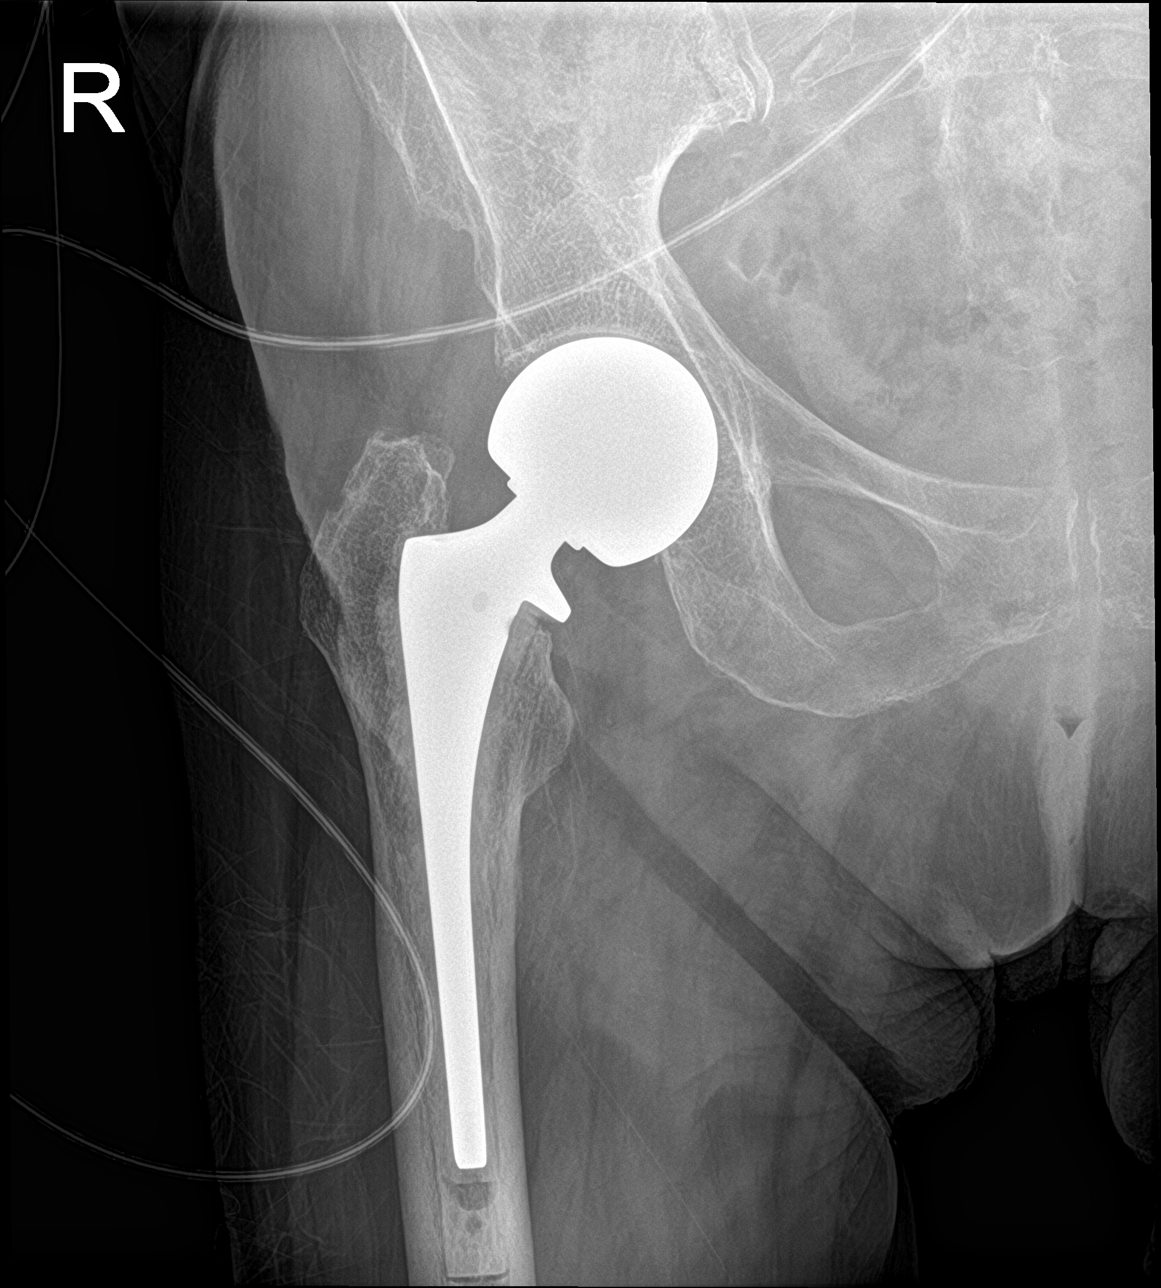

[hip lat]
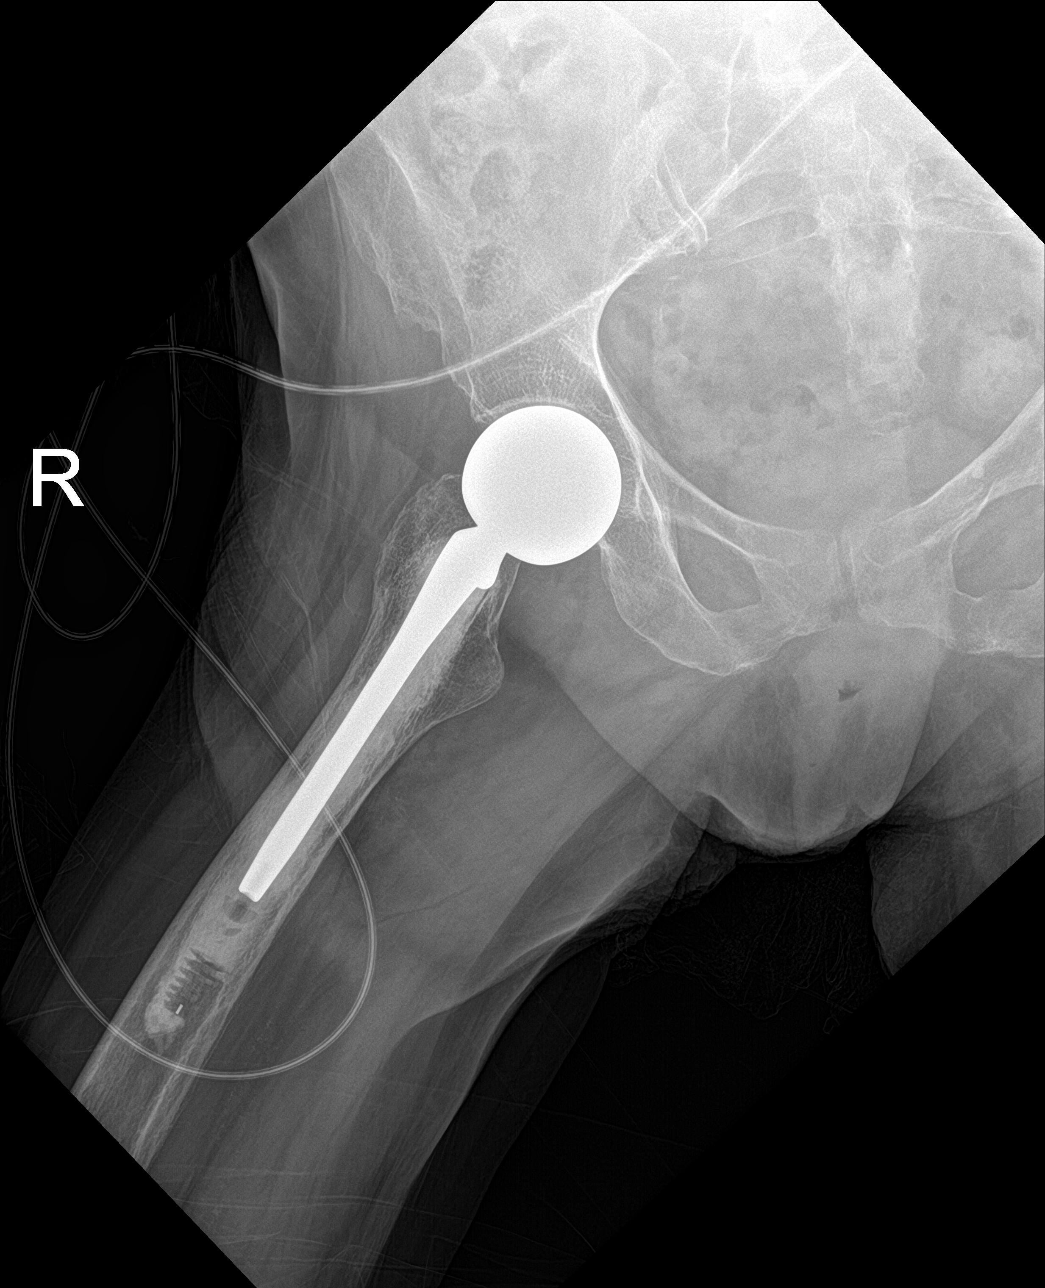

[3 of 3 positions shown; findings below may reference images not displayed]

FINDINGS: There is a right hip arthroplasty in normal alignment without
evidence of loosening or periprosthetic fracture. There is a
cortical step-off along the inner margin of the right pubic body.
Prior left proximal femur fixation, partially visualized.
IMPRESSION: Possible nondisplaced right pubic body fracture, recommend CT.

No evidence of right hip arthroplasty complication.

## 2023-05-24 IMAGING — CT CT HIP*R* W/O CM
2 of 4 series · 17 of 46 positions shown, 19 images · non-contrast
Comparison: Right hip x-rays from same day.

CLINICAL DATA: Right hip pain after fall yesterday.

EXAM:
CT OF THE RIGHT HIP WITHOUT CONTRAST
TECHNIQUE: Multidetector CT imaging of the right hip was performed according to
the standard protocol. Multiplanar CT image reconstructions were
also generated.
RADIATION DOSE REDUCTION: This exam was performed according to the
departmental dose-optimization program which includes automated
exposure control, adjustment of the mA and/or kV according to
patient size and/or use of iterative reconstruction technique.

[Series 5: axial soft tissue (person_name) · axial · 0.58mm/px · z∈[+654,+944]mm · 14 of 166 slices shown, 16 images]
[im 11/166  soft-tissue]
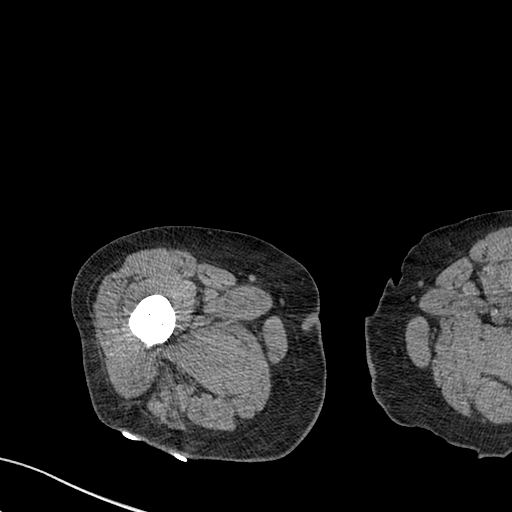
[im 11/166  bone]
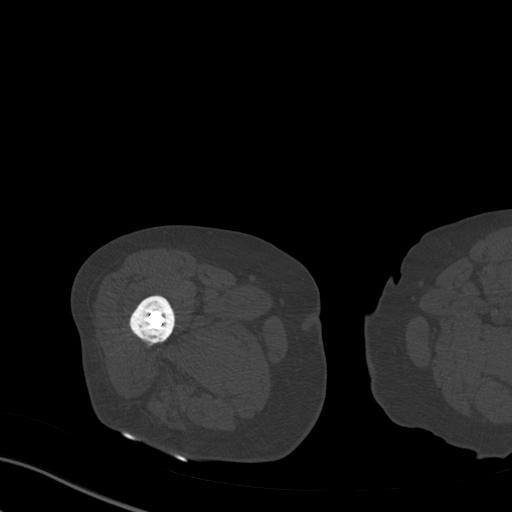
[im 22/166  soft-tissue]
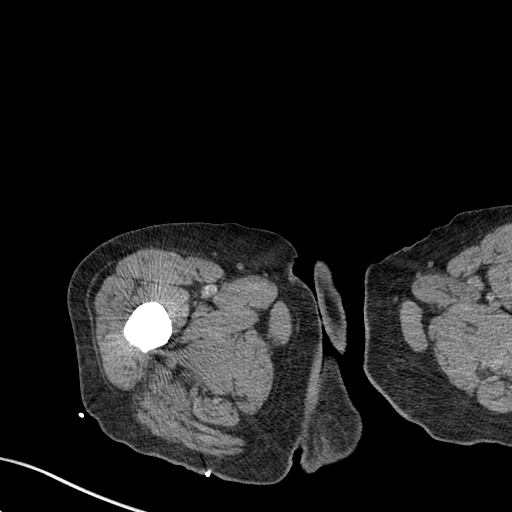
[im 32/166  soft-tissue]
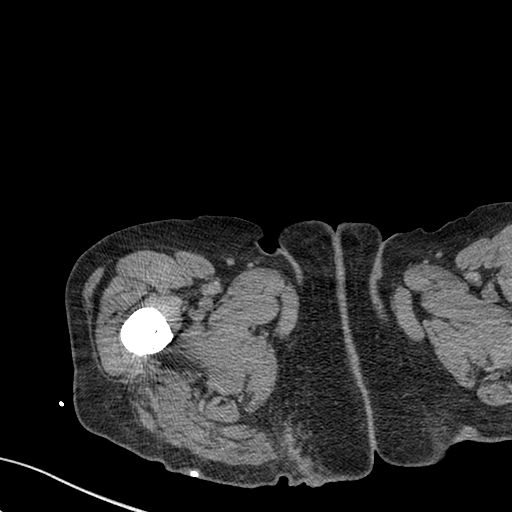
[im 43/166  soft-tissue]
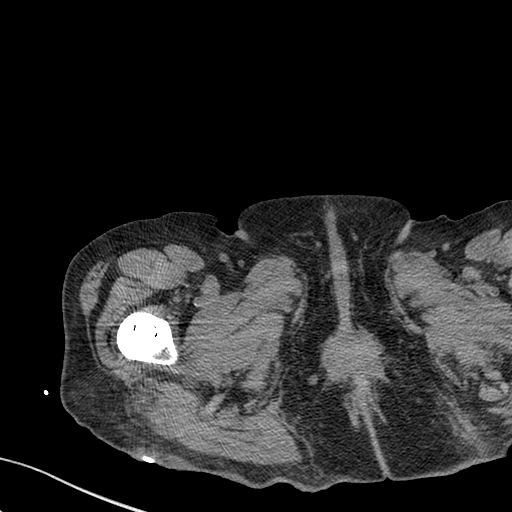
[im 54/166  soft-tissue]
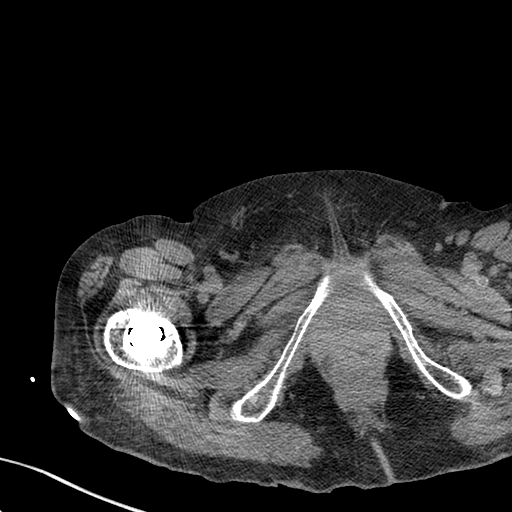
[im 64/166  soft-tissue]
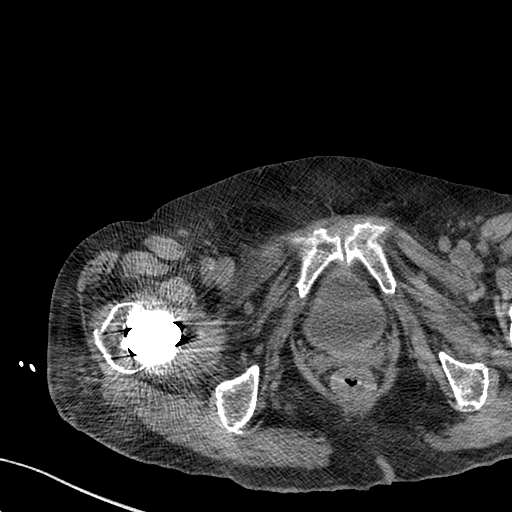
[im 75/166  soft-tissue]
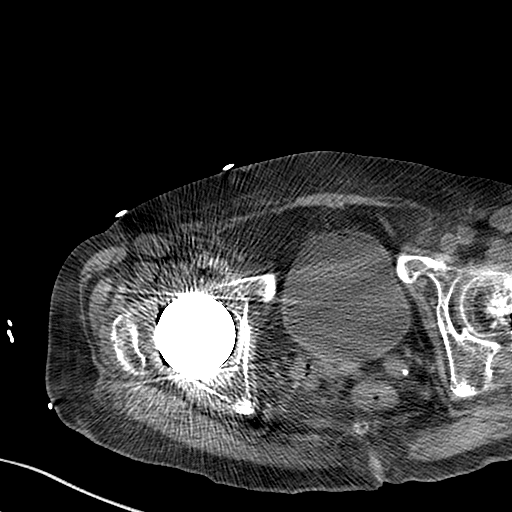
[im 91/166  soft-tissue]
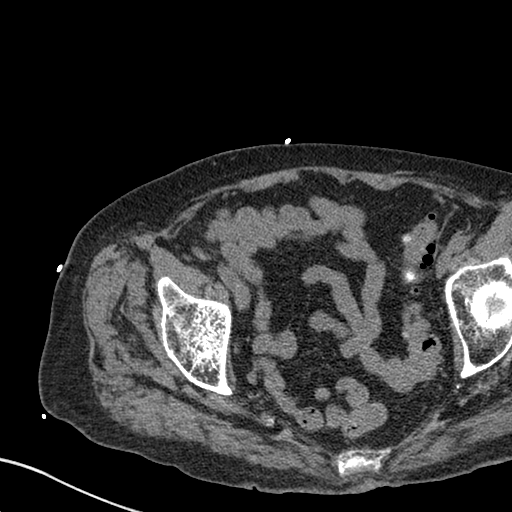
[im 102/166  soft-tissue]
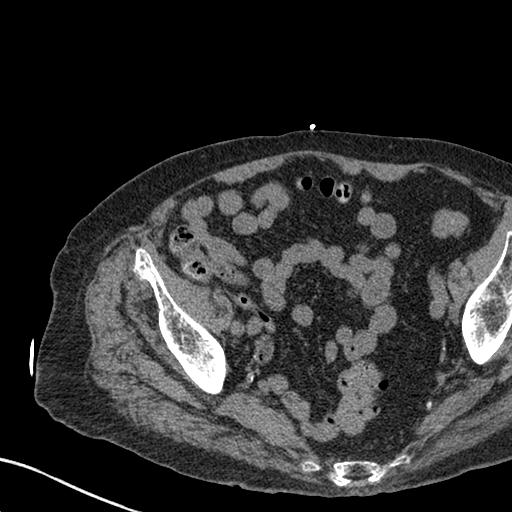
[im 102/166  bone]
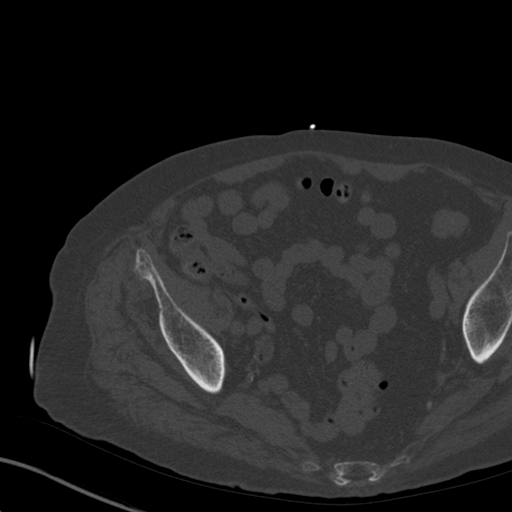
[im 112/166  soft-tissue]
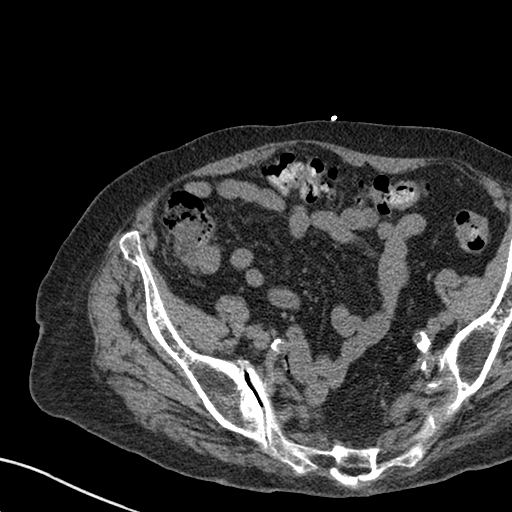
[im 123/166  soft-tissue]
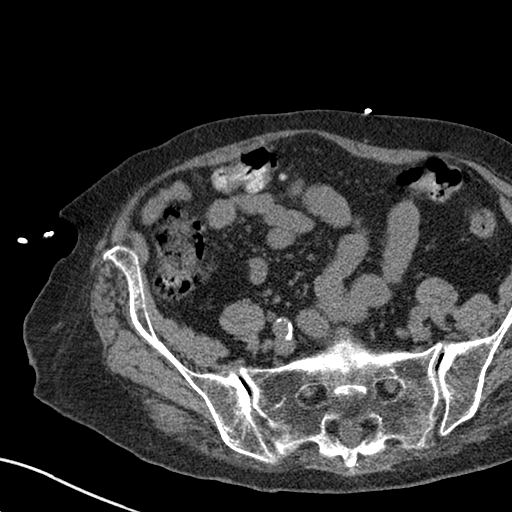
[im 134/166  soft-tissue]
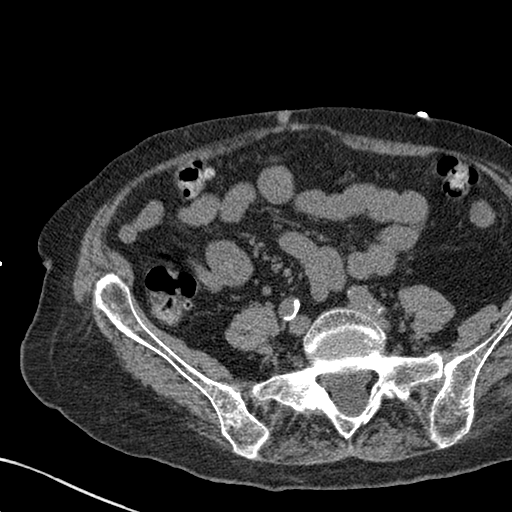
[im 144/166  soft-tissue]
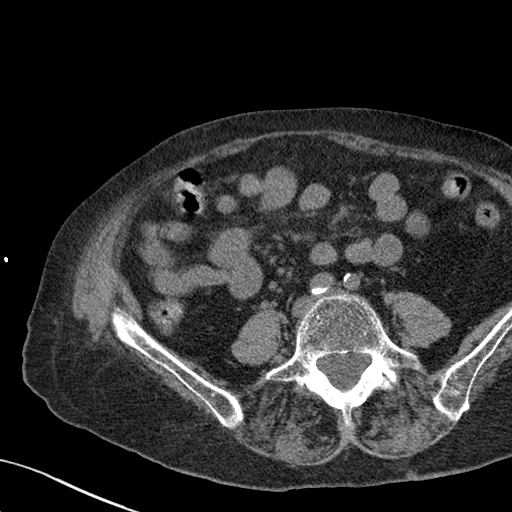
[im 155/166  soft-tissue]
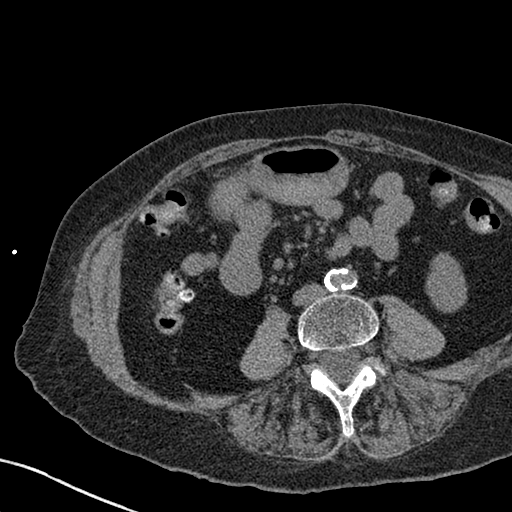

[Series 8: coronal st · coronal · 0.70mm/px · 3 of 147 slices shown]
[im 30/147  soft-tissue]
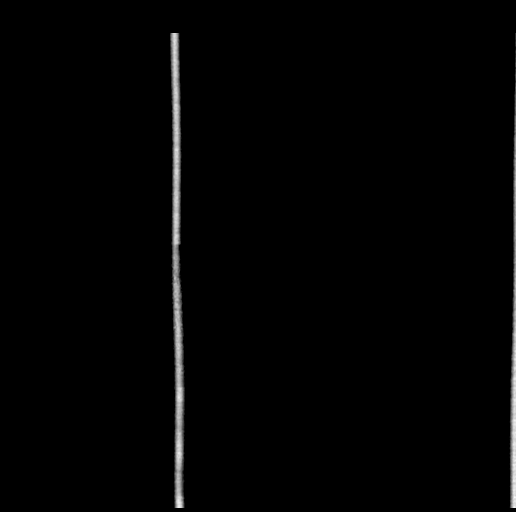
[im 59/147  soft-tissue]
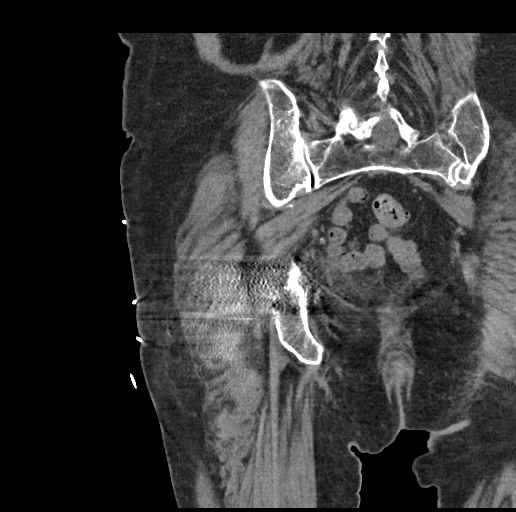
[im 88/147  soft-tissue]
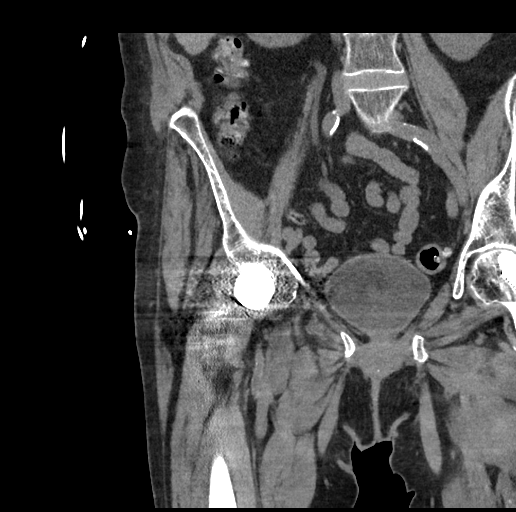

[17 of 46 positions shown; findings below may reference images not displayed]

FINDINGS: Bones/Joint/Cartilage

Prior right hip hemiarthroplasty. Prior left femur ORIF. No evidence
of hardware failure or loosening. No fracture or dislocation.
Osteopenia. No joint effusion.

Ligaments

Ligaments are suboptimally evaluated by CT.

Muscles and Tendons
Grossly intact.

Soft tissue
No fluid collection or hematoma.  No soft tissue mass.
IMPRESSION: 1. No acute osseous abnormality.
2. Prior right hip hemiarthroplasty and left femur ORIF. No evidence
of hardware complication.

## 2023-05-24 IMAGING — CT CT CERVICAL SPINE W/O CM
3 of 4 series · 14 of 33 positions shown, 17 images · non-contrast
Comparison: Head CT 11/12/2021.  Cervical spine CT 11/11/2021.

CLINICAL DATA: Head trauma, moderate-severe; Neck trauma, dangerous
injury mechanism. Unwitnessed falls yesterday.



[Series 5: sagittals · sagittal · 0.33mm/px · 5 of 62 slices shown, 6 images]
[im 21/62  bone]
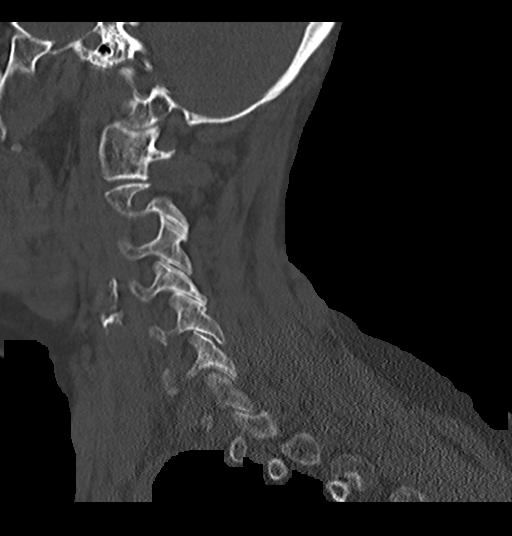
[im 26/62  bone]
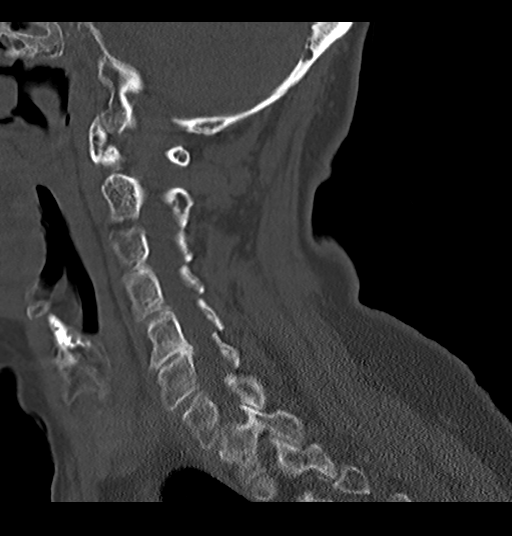
[im 31/62  soft-tissue]
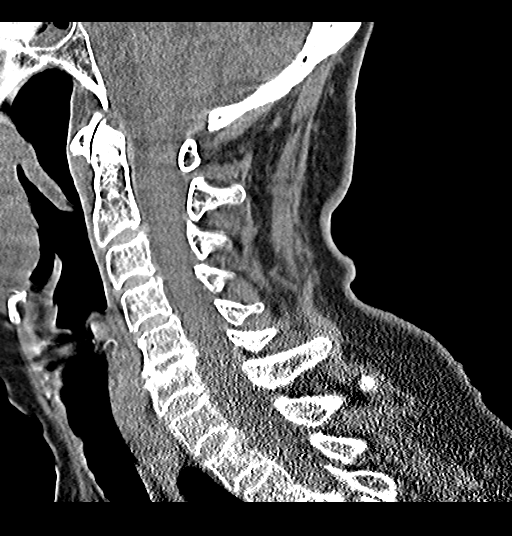
[im 31/62  bone]
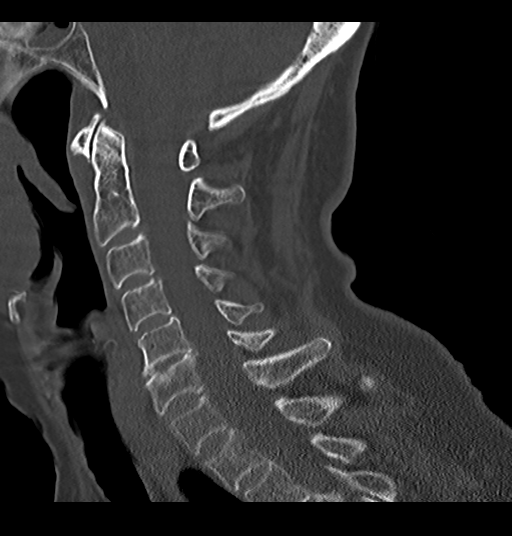
[im 36/62  bone]
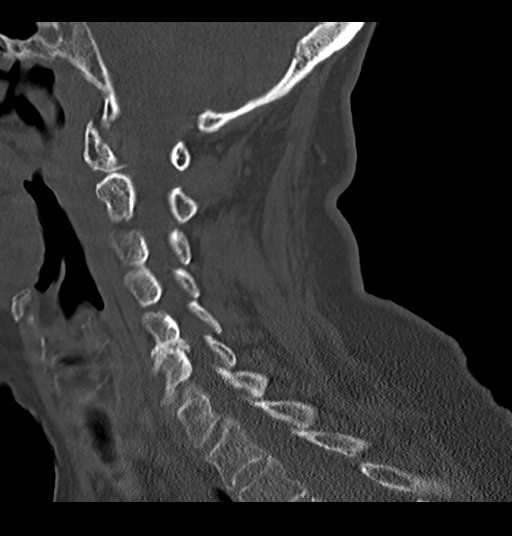
[im 41/62  bone]
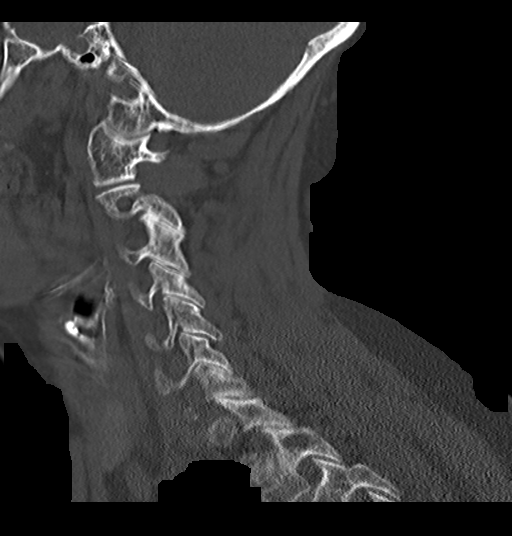

[Series 6: coronals · coronal · 0.31mm/px · 3 of 68 slices shown]
[im 18/68  bone]
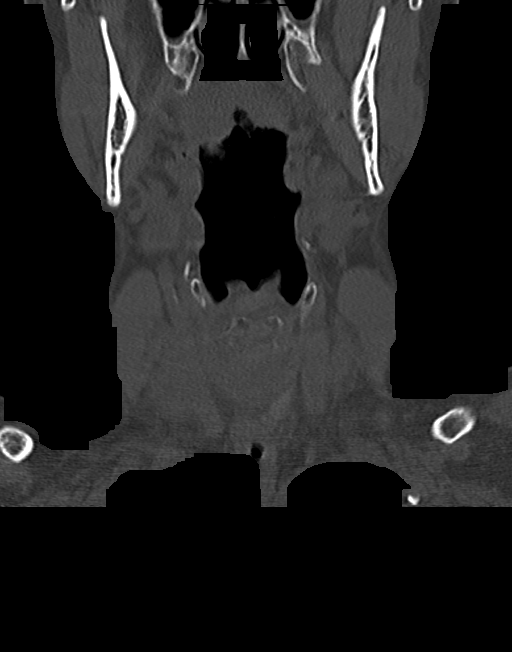
[im 29/68  bone]
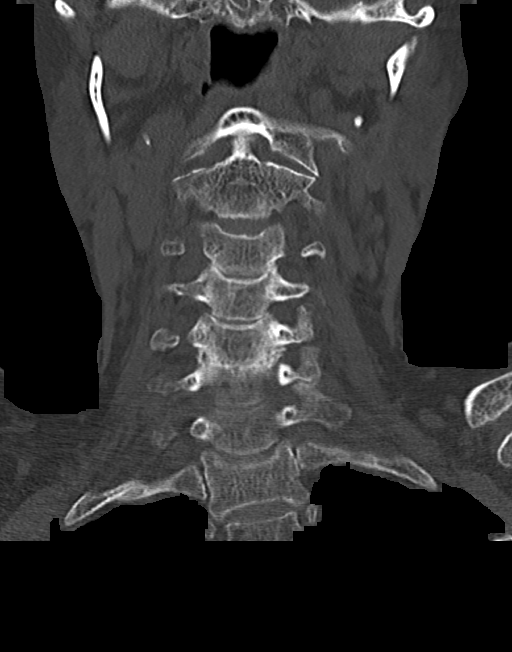
[im 40/68  bone]
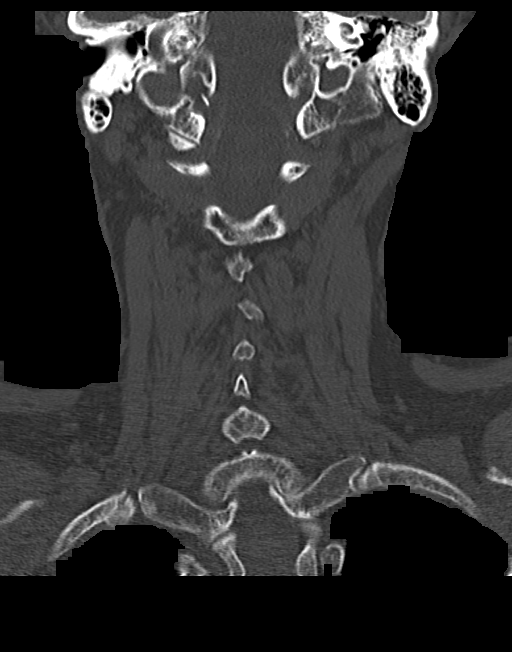

[Series 7: orthogonals · axial · 0.25mm/px · z∈[-317,-178]mm · 6 of 114 slices shown, 8 images]
[im 17/114  soft-tissue]
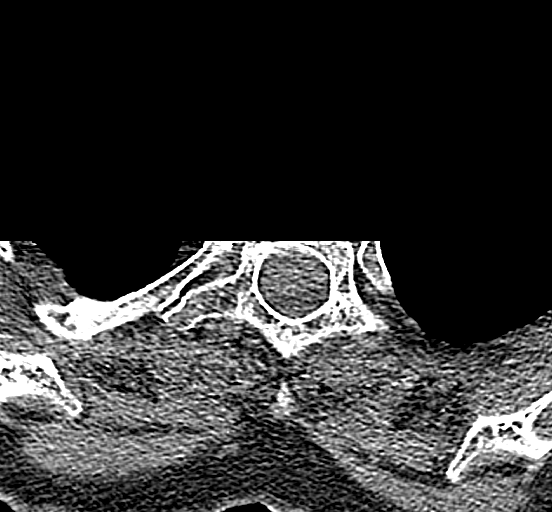
[im 17/114  bone]
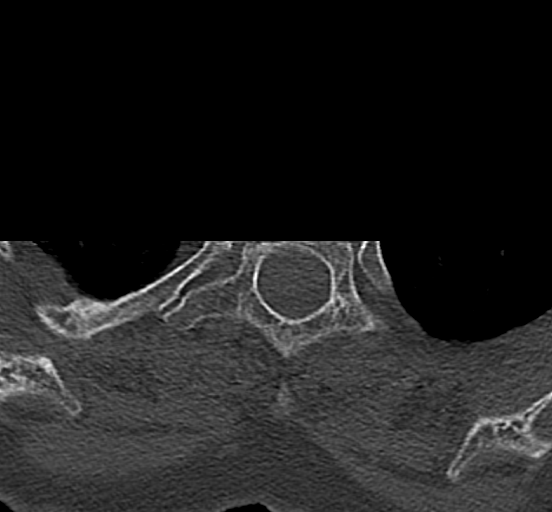
[im 33/114  bone]
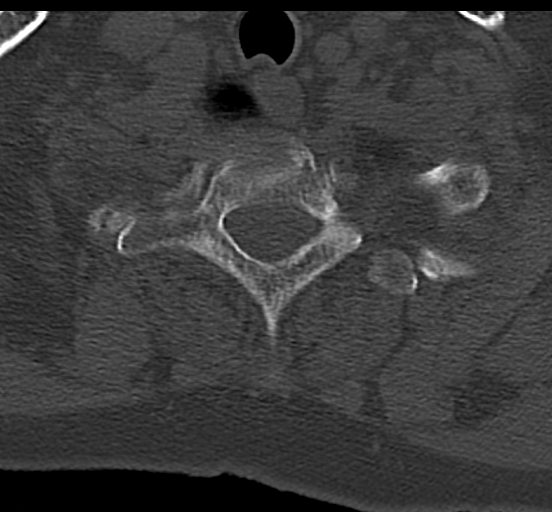
[im 49/114  bone]
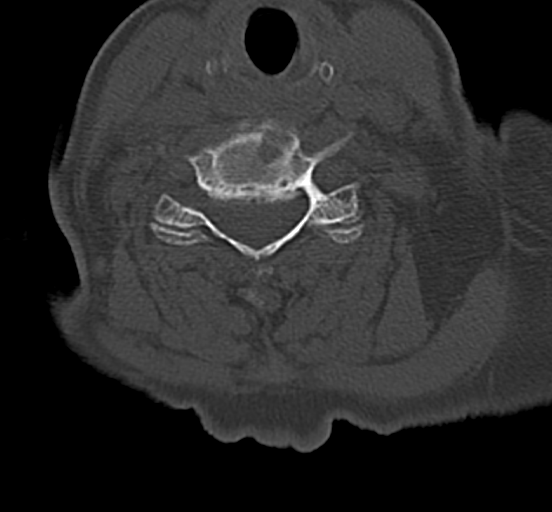
[im 65/114  bone]
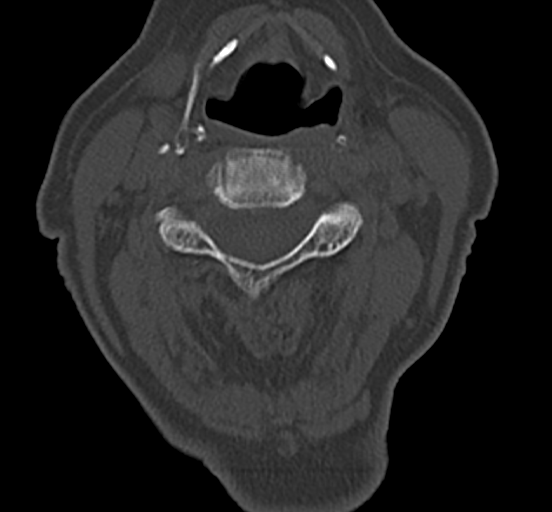
[im 81/114  soft-tissue]
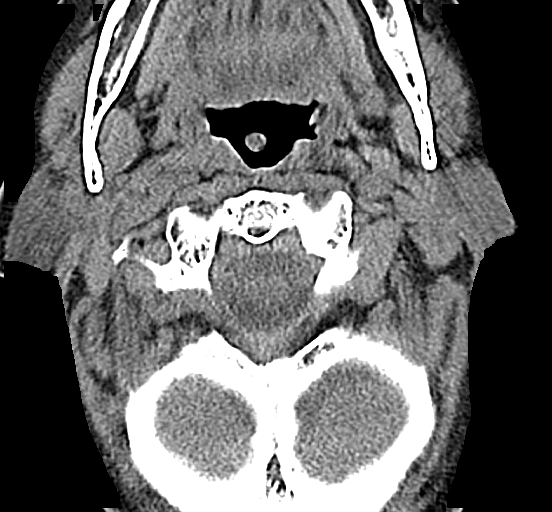
[im 81/114  bone]
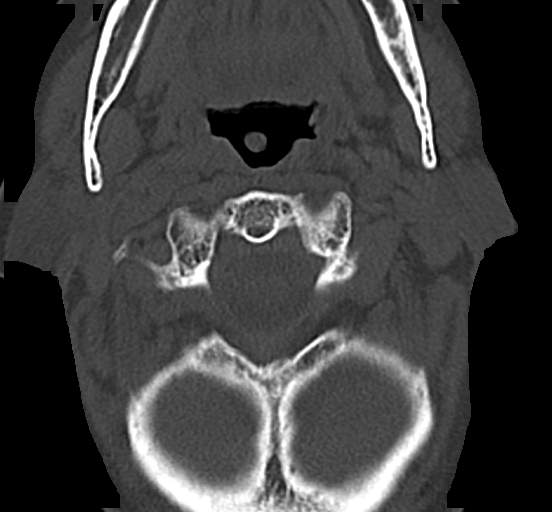
[im 97/114  bone]
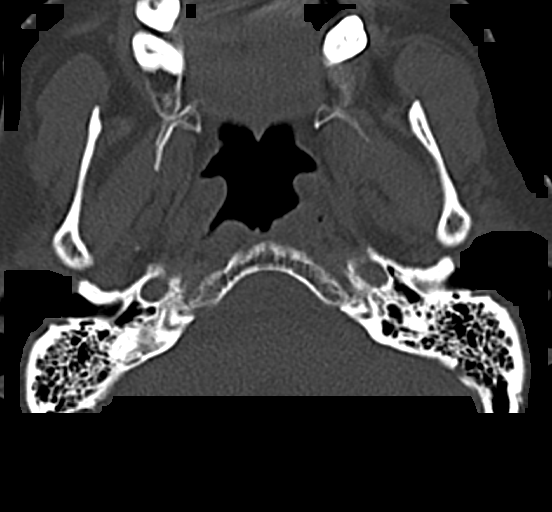

[14 of 33 positions shown; findings below may reference images not displayed]

FINDINGS: CT HEAD FINDINGS

Brain: There is no evidence of an acute infarct, intracranial
hemorrhage, mass, midline shift, or extra-axial fluid collection.
Mild cerebral atrophy is within normal limits for age. Hypodensities
in the cerebral white matter bilaterally are unchanged and
nonspecific but compatible with mild chronic small vessel ischemic
disease. A chronic infarct is again noted involving the anterior
left basal ganglia and adjacent white matter. A focus of coarse
calcification in the quadrigeminal cistern on the right is
unchanged.

Vascular: Calcified atherosclerosis at the skull base. No hyperdense
vessel.

Skull: No acute fracture or suspicious osseous lesion.

Sinuses/Orbits: Mild mucosal thickening in the paranasal sinuses.
Clear mastoid air cells. Bilateral cataract extraction.

Other: None.

CT CERVICAL SPINE FINDINGS

Alignment: Straightening of the normal cervical lordosis. No
evidence of acute traumatic subluxation.

Skull base and vertebrae: No acute fracture or suspicious osseous
lesion. Old right first through third rib fractures.

Soft tissues and spinal canal: No prevertebral fluid or swelling. No
visible canal hematoma.

Disc levels: Disc degeneration greatest at C5-6 where there is
moderate disc space narrowing, degenerative endplate sclerosis, and
spurring. No evidence of high-grade stenosis.

Upper chest: No apical lung consolidation or mass.

Other: Prominent calcific atherosclerosis at the carotid
bifurcations.
IMPRESSION: 1. No evidence of acute intracranial abnormality.
2. Mild chronic small vessel ischemic disease.
3. No evidence of acute fracture or traumatic subluxation in the
cervical spine.
